# Patient Record
Sex: Female | Born: 1965 | Race: Black or African American | Hispanic: No | Marital: Married | Smoking: Never smoker
Health system: Southern US, Community
[De-identification: ages and names within clinical notes are randomized; demographics above are authoritative.]

## PROBLEM LIST (undated history)

## (undated) DIAGNOSIS — J45909 Unspecified asthma, uncomplicated: Secondary | ICD-10-CM

## (undated) DIAGNOSIS — N920 Excessive and frequent menstruation with regular cycle: Secondary | ICD-10-CM

## (undated) DIAGNOSIS — D242 Benign neoplasm of left breast: Secondary | ICD-10-CM

## (undated) DIAGNOSIS — I10 Essential (primary) hypertension: Secondary | ICD-10-CM

## (undated) HISTORY — DX: Unspecified asthma, uncomplicated: J45.909

## (undated) HISTORY — DX: Benign neoplasm of left breast: D24.2

## (undated) HISTORY — DX: Excessive and frequent menstruation with regular cycle: N92.0

---

## 2001-02-22 ENCOUNTER — Other Ambulatory Visit: Admission: RE | Admit: 2001-02-22 | Discharge: 2001-02-22 | Payer: Self-pay | Admitting: Obstetrics and Gynecology

## 2001-05-08 ENCOUNTER — Inpatient Hospital Stay (HOSPITAL_COMMUNITY): Admission: AD | Admit: 2001-05-08 | Discharge: 2001-05-08 | Payer: Self-pay | Admitting: Obstetrics and Gynecology

## 2001-05-09 ENCOUNTER — Encounter: Payer: Self-pay | Admitting: Obstetrics and Gynecology

## 2002-03-22 ENCOUNTER — Other Ambulatory Visit: Admission: RE | Admit: 2002-03-22 | Discharge: 2002-03-22 | Payer: Self-pay | Admitting: Obstetrics and Gynecology

## 2002-06-13 ENCOUNTER — Inpatient Hospital Stay (HOSPITAL_COMMUNITY): Admission: AD | Admit: 2002-06-13 | Discharge: 2002-06-17 | Payer: Self-pay | Admitting: Obstetrics and Gynecology

## 2002-06-15 ENCOUNTER — Encounter (INDEPENDENT_AMBULATORY_CARE_PROVIDER_SITE_OTHER): Payer: Self-pay | Admitting: Specialist

## 2004-07-10 ENCOUNTER — Other Ambulatory Visit: Admission: RE | Admit: 2004-07-10 | Discharge: 2004-07-10 | Payer: Self-pay | Admitting: Obstetrics and Gynecology

## 2006-02-04 ENCOUNTER — Other Ambulatory Visit: Admission: RE | Admit: 2006-02-04 | Discharge: 2006-02-04 | Payer: Self-pay | Admitting: Obstetrics and Gynecology

## 2008-05-04 ENCOUNTER — Ambulatory Visit (HOSPITAL_BASED_OUTPATIENT_CLINIC_OR_DEPARTMENT_OTHER): Admission: RE | Admit: 2008-05-04 | Discharge: 2008-05-04 | Payer: Self-pay | Admitting: Obstetrics and Gynecology

## 2008-05-04 ENCOUNTER — Ambulatory Visit: Payer: Self-pay | Admitting: Diagnostic Radiology

## 2010-05-16 ENCOUNTER — Ambulatory Visit (INDEPENDENT_AMBULATORY_CARE_PROVIDER_SITE_OTHER)
Admission: RE | Admit: 2010-05-16 | Discharge: 2010-05-16 | Payer: BC Managed Care – PPO | Source: Home / Self Care | Attending: Obstetrics and Gynecology | Admitting: Obstetrics and Gynecology

## 2010-05-16 DIAGNOSIS — Z1231 Encounter for screening mammogram for malignant neoplasm of breast: Secondary | ICD-10-CM

## 2010-05-16 DIAGNOSIS — R922 Inconclusive mammogram: Secondary | ICD-10-CM

## 2010-05-21 ENCOUNTER — Other Ambulatory Visit: Payer: Self-pay | Admitting: Obstetrics and Gynecology

## 2010-05-21 DIAGNOSIS — R928 Other abnormal and inconclusive findings on diagnostic imaging of breast: Secondary | ICD-10-CM

## 2010-05-30 ENCOUNTER — Ambulatory Visit
Admission: RE | Admit: 2010-05-30 | Discharge: 2010-05-30 | Disposition: A | Discharge status: Home / Self Care | Payer: BC Managed Care – PPO | Source: Ambulatory Visit | Attending: Obstetrics and Gynecology | Admitting: Obstetrics and Gynecology

## 2010-05-30 DIAGNOSIS — R928 Other abnormal and inconclusive findings on diagnostic imaging of breast: Secondary | ICD-10-CM

## 2010-07-02 ENCOUNTER — Encounter (HOSPITAL_BASED_OUTPATIENT_CLINIC_OR_DEPARTMENT_OTHER)
Admission: RE | Admit: 2010-07-02 | Discharge: 2010-07-02 | Disposition: A | Discharge status: Home / Self Care | Payer: BC Managed Care – PPO | Source: Ambulatory Visit | Attending: Specialist | Admitting: Specialist

## 2010-07-02 LAB — BASIC METABOLIC PANEL
BUN: 16 mg/dL (ref 6–23)
Calcium: 8.7 mg/dL (ref 8.4–10.5)
GFR calc non Af Amer: 50 mL/min — ABNORMAL LOW (ref >60)
Glucose, Bld: 118 mg/dL — ABNORMAL HIGH (ref 70–99)

## 2010-07-02 LAB — DIFFERENTIAL
Eosinophils Relative: 2 % (ref 0–5)
Lymphocytes Relative: 31 % (ref 12–46)
Lymphs Abs: 2.1 10*3/uL (ref 0.7–4.0)
Monocytes Absolute: 0.5 10*3/uL (ref 0.1–1.0)

## 2010-07-02 LAB — CBC
HCT: 35.3 % — ABNORMAL LOW (ref 36.0–46.0)
MCH: 28.2 pg (ref 26.0–34.0)
MCHC: 33.1 g/dL (ref 30.0–36.0)
MCV: 85.1 fL (ref 78.0–100.0)
RDW: 13.4 % (ref 11.5–15.5)

## 2010-07-08 ENCOUNTER — Ambulatory Visit (HOSPITAL_BASED_OUTPATIENT_CLINIC_OR_DEPARTMENT_OTHER)
Admission: RE | Admit: 2010-07-08 | Discharge: 2010-07-08 | Disposition: A | Discharge status: Home / Self Care | Payer: BC Managed Care – PPO | Source: Ambulatory Visit | Attending: Specialist | Admitting: Specialist

## 2010-07-08 ENCOUNTER — Other Ambulatory Visit: Payer: Self-pay | Admitting: Specialist

## 2010-07-08 DIAGNOSIS — D1739 Benign lipomatous neoplasm of skin and subcutaneous tissue of other sites: Secondary | ICD-10-CM | POA: Insufficient documentation

## 2010-07-08 DIAGNOSIS — Z01812 Encounter for preprocedural laboratory examination: Secondary | ICD-10-CM | POA: Insufficient documentation

## 2010-07-08 DIAGNOSIS — Z01818 Encounter for other preprocedural examination: Secondary | ICD-10-CM | POA: Insufficient documentation

## 2010-08-21 NOTE — Op Note (Signed)
  [name], [name]                  ACCOUNT NO.:  [id number]  MEDICAL RECORD NO.:  [id number]           PATIENT TYPE:  LOCATION:                                 FACILITY:  PHYSICIAN:  Earvin Hansen L. 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 The area has gotten bigger.  Increased pain and discomfort in the area and also is causing problems or range of motion with tenseness and stiffness.  PROCEDURES PLANNED:  Exploration of area, removal of the area liposuction assistance.  ANESTHESIA:  General.  The patient underwent general anesthesia and intubated orally.  Prep was done to the left shoulder and chest areas with a Hibiclens soap and solution walled off with sterile towels and drapes so as to make a sterile field.  Xylocaine 0.5% with epinephrine 1:200,000 concentration was injected locally within the mass, total of 30 mL.  This was allowed to set up and we then made a small incision laterally with a 15 blade and used a [place] catheter 20 - 3 enforced.  We were able to liposuction the area completely down with consistency of a lipoma subfascial.  So residual areas laterally was removed with direct excision using Metzenbaum scissors.  After proper hemostasis, the wound was then reclosed with 3-0 Monocryl x2 layers and a running subcuticular stitch of 3-0 Monocryl.  Steri-Strips and soft dressings were applied to all areas of pressure fashion with Hypafix tape and Xeroform.  She withstood the procedures very well, was taken to recovery in good condition.  ESTIMATED BLOOD LOSS:  Less than 50 mL.  COMPLICATIONS:  None.     Yaakov Guthrie. Shon Hough, M.D.     Cathie Hoops  D:  07/08/2010  T:  07/09/2010  Job:  [id number]  Electronically Signed by Louisa Second M.D. on 08/21/2010 04:09:30 PM

## 2010-09-06 NOTE — Discharge Summary (Signed)
[name], [name]                            ACCOUNT NO.:  [id number]   MEDICAL RECORD NO.:  [id number]                   PATIENT TYPE:  INP   LOCATION:  9120                                 [facility]ILITY:  [facility]   PHYSICIAN:  Osborn Coho, M.D.                DATE OF BIRTH:  [birth year]   DATE OF ADMISSION:  06/13/2002  DATE OF DISCHARGE:  06/17/2002                                 DISCHARGE SUMMARY   ADMISSION DIAGNOSES:  1. Intrauterine pregnancy at term.  2. Favorable cervix.  3. Multiple social issues.  4. Positive group B strep.  5. Desires induction of labor.  6. Multiparity.  7. Desires bilateral tubal ligation for sterilization.   DISCHARGE DIAGNOSES:  1. Intrauterine pregnancy at term.  2. Favorable cervix.  3. Multiple social issues.  4. Positive group B strep.  5. Desires induction of labor.  6. Multiparity.  7. Desires bilateral tubal ligation for sterilization.  8. Status post normal spontaneous vaginal delivery.  9. Status post bilateral tubal ligation for sterilization.  10.      Breastfeeding.   PROCEDURES THIS ADMISSION:  1. Normal spontaneous vaginal delivery of a viable female infant named Devin     who weighed 7 pounds 2 ounces and had Apgars of 9 and 9 on June 14, 2002 attended by Nigel Bridgeman, C.N.M.  2. Bilateral tubal ligation for sterilization on June 15, 2002 by Dr.     Jaymes Graff.   HOSPITAL COURSE:  The patient is a 45 year old married black female gravida  3 para 1-0-1-1 at 75 and six-sevenths weeks who was admitted for elective  induction of labor secondary to favorable cervix and multiple social issues.  She was artificially ruptured and progressed into labor spontaneously,  requiring Pitocin augmentation early in the morning around 4:30 a.m. on  June 14, 2002.  She also requested an epidural for labor at  approximately the same time.  She progressed steadily to delivery of a  viable female infant named Devin who weighed 7  pounds 2 ounces and had  Apgars of 9 and 9 on June 14, 2002 attended by Nigel Bridgeman, C.N.M.  She  desired bilateral tubal ligation for sterilization and underwent the same on  June 15, 2002 without complication by Dr. Jaymes Graff.  Postoperatively she has done well.  She is ambulating, voiding, and eating  without difficulty.  She did have a febrile incident of a temperature of  101.3 on approximately midnight of June 16, 2002.  She received standing  antibiotic treatment at that time and her temperature subsequently resolved,  and she has remained afebrile for greater than 24 hours since then.  She is  now deemed ready for discharge today.   DISCHARGE INSTRUCTIONS:  As per the [facility] OB[facility]N handout.    DISCHARGE MEDICATIONS:  1. Motrin 600 mg p.o. q.6h. p.r.n. pain.  2.  Tylox one to two p.o. q.4-6h. p.r.n. pain.  3. Prenatal vitamins daily.   DISCHARGE LABORATORY DATA:  Hemoglobin 11.0, wbc count 10.0, platelets 189.   DISCHARGE FOLLOW-UP:  In six weeks at [facility] or p.r.n.     Concha Pyo. Duplantis, C.N.M.              Osborn Coho, M.D.    SJD/MEDQ  D:  06/17/2002  T:  06/17/2002  Job:  [id number]

## 2010-09-06 NOTE — H&P (Signed)
[name], [name]                            ACCOUNT NO.:  [id number]   MEDICAL RECORD NO.:  [id number]                   PATIENT TYPE:  INP   LOCATION:  9171                                 [facility]ILITY[facility]   PHYSICIAN:  Vicki L. Emilee Barber, C.N.M.             DATE OF BIRTH:  1965/05/28   DATE OF ADMISSION:  06/13/2002  DATE OF DISCHARGE:                                HISTORY & PHYSICAL   HISTORY OF PRESENT ILLNESS:  [name] is a 45 year old Gravida III, Para I,  0/1/1 at 45 and 6/7th weeks, who presents for elective induction. The  patient has work issues that will require return to work by July 27, 2002.  The patient requested induction secondary to desiring full six week leave.  She understands the risks and benefits of induction including failure of  attempt, prolonged labor and a requirement of Cesarean section. She does  wish to proceed. The pregnancy has been remarkable for positive group B  strep, history of irregular menses, history of post partum depression.   PRENATAL LABORATORY DATA:  Blood type is O+, Rh antibody negative, VDRL non-  reactive, Rubella titer positive, hepatitis B surface antigen negative, HIV  non-reactive, sickle cell test negative, GC and Chlamydia cultures negative  in June. PAP was normal. AFP was normal. Hemoglobin upon entry to practice  was 12.7. It was 11.9 at 27 weeks. Estimated date of confinement of April 15, 2003 was established by 8 week ultrasound. Group B strep culture was  positive at 36 weeks.   HISTORY OF PRESENT ILLNESS:  The patient under care at approximately ten  weeks. She had an ultrasound at 18 weeks secondary to irregular menses.  Dating was consistent with previous dates. Normal anatomy was noted. At  approximately 36 weeks, she advised [place] that she had a requirement to return  to work by July 27, 2002. This would require a delivery by June 16, 2002  to allow for a full six week postpartum recovery. At that time,  induction  was discussed, although she was counseled that no medical reason for  induction existed and the risks and benefits were discussed. She had her  membranes stripped twice in the last four days in an attempt to stimulate  spontaneous labor. She was seen earlier today and again, risks and benefits  of induction were discussed with the patient by [name], C.N.M.  The patient did make the decision to proceed with induction.   OBSTETRICAL HISTORY:  In 1995, she had a vaginal birth of a female infant with  weight of 7 pounds and 6 ounces at 41 weeks. She was in labor 16-18 hours.  She had vacuum assisted vaginal birth. She had epidural anesthesia but no  complications. She did have some questionable post partum depression. 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 In January of 2003, she had a  spontaneous  miscarriage at 7-8 weeks without complication.   PAST MEDICAL HISTORY:  She was told years ago that she had a cyst on her  uterus but no current problems. She reports occasional yeast infections.  Reports usual childhood illnesses. She has history of hepatitis vaccination  immunization. She was on Ortho Evra until February of 2003. She had anemia  before and after her pregnancy in 1995. 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 She has questionable history  of migraines in the past but no official diagnosis.   ALLERGIES:  No known drug allergies.   FAMILY HISTORY:  Her father and maternal grandfather have had heart attacks.  Her mother, father, sister, and grandparents all have hypertension. Her  father and paternal grandmother all have diabetes mellitus. Her uncle had a  kidney transplant.   GENETIC HISTORY:  Family history of obesity.   SOCIAL HISTORY:  The patient is married to the father of the baby. His name  is [name]. The patient is African-American. She is of the [address] and  [employer]. She is graduate  educated. 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 Her husband is also graduate educated. He is also employed as a  Runner, broadcasting/film/video at [employer]. She has been followed by the Certified Nurse Midwife  Services at [facility] and Gynecologic Services. She denies  any alcohol, drug, or tobacco use during this pregnancy.   PHYSICAL EXAMINATION:  VITAL SIGNS: Stable. Afebrile.  HEENT: Within normal limits.  LUNGS: Clear breath sounds.  HEART: Regular rate and rhythm.  No murmur, rub, or gallop.  BREAST: Soft and nontender.  ABDOMEN: Fundal height is approximately 39 cm. Estimated fetal weight is 7-8  pounds. Uterine contractions every 3-5 minutes, 50 seconds, moderate  quality. Cervical examination 2-3 cm, 80% vertex and minus one station.  Fetal heart rate is reactive with no decelerations.  EXTREMITIES: Deep tendon reflexes are 2+ without clonus. There is a trace  edema noted.   IMPRESSION:  1. Intrauterine pregnancy at term.  2. Social issues with the patient desiring induction.  3. Favorable cervix.  4. Positive group B strep.   PLAN:  1. Admit to birthing suite for consult with Dr. Estanislado Pandy as attending     physician.  2. Plan epidural per patient request.  3. Artificial rupture of membranes as appropriate.  4. Plan group B strep prophylaxis with Penicillin per standard dosing.                                               [name], C.N.M.    VLL/MEDQ  D:  06/14/2002  T:  06/14/2002  Job:  [id number]   cc:   [name], M.D.  [address]  [place]  [place] [address]  Fax: [phone]

## 2010-09-06 NOTE — Op Note (Signed)
NAMEAZUCENA, DART                            ACCOUNT NO.:  [id number]   MEDICAL RECORD NO.:  [id number]                   PATIENT TYPE:  INP   LOCATION:  9120                                 [facility]ILITY[facility]   PHYSICIAN:  Naima A. Dillard, M.D.              DATE OF BIRTH:  1965-11-10   DATE OF PROCEDURE:  DATE OF DISCHARGE:                                 OPERATIVE REPORT   PREOPERATIVE DIAGNOSES:  Multiparity, desires sterilization.   POSTOPERATIVE DIAGNOSES:  Multiparity, desires sterilization.   PROCEDURE:  Postpartum tubal ligation.   ANESTHESIA:  General endotracheal tube.   SURGEON:  Naima A. Dillard, M.D.   IV FLUIDS:  400 mL crystalloid.   ESTIMATED BLOOD LOSS:  Minimal.   COMPLICATIONS:  None.   FINDINGS:  Normal appearing tubes bilaterally.  Normal appearing right  ovary.   PROCEDURE IN DETAIL:  Before the operation the patient was informed of all  forms of birth control.  Also told that the risks of the procedure are, but  not limited to, bleeding, infection, damage to internal organs such as  bowel, bladder, major blood vessels, and has a failure rate of about 1 in  250 with a tubal ligation.  The patient still consented to the procedure.   PROCEDURE IN DETAIL:  The patient was taken to the operating room where she  had an epidural but it was found to be inadequate.  She was given general  anesthesia.  She was prepped and draped in a normal sterile fashion.  5 mL  of 0.5% Marcaine with epinephrine was placed in an infraumbilical area.  About a 2 cm infraumbilical incision was then made with a scalpel and  carried down to the fascia.  Fascia was then incised and dissected  bilaterally using Mayo scissors.  The peritoneum was identified, tented up,  entered sharply with Metzenbaum scissors and extended bilaterally.  The  patient's right fallopian tube was identified, grasped with Babcock clamps,  followed out to the fimbriated end.  Right ovary was also seen  and noted to  be normal.  About 1.5 cm of the mid isthmic portion of the tube was ligated  with 2-0 plain and excised.  Hemostasis was assured.  The tube was returned  back to the abdomen.  The patient's left fallopian tube was identified,  followed out to the fimbriated end, grasped with Babcock clamps, and ligated  with 2-0 plain.  About a 1.5 cm segment of tube was removed from the mid  isthmic portion of the tube.  Hemostasis was assured.  The fascia was closed  with 0 Vicryl.  The skin was closed with 3-0 Vicryl in a subcuticular  stitch.  Sponge, lap, and needle counts were correct x2.  The patient went  to recovery room in stable condition.  Naima A. Normand Sloop, M.D.   NAD/MEDQ  D:  06/15/2002  T:  06/15/2002  Job:  [id number]

## 2010-11-20 ENCOUNTER — Other Ambulatory Visit: Payer: Self-pay | Admitting: Obstetrics and Gynecology

## 2010-11-20 DIAGNOSIS — N6009 Solitary cyst of unspecified breast: Secondary | ICD-10-CM

## 2010-12-03 ENCOUNTER — Other Ambulatory Visit: Payer: BC Managed Care – PPO

## 2011-01-07 ENCOUNTER — Other Ambulatory Visit: Payer: BC Managed Care – PPO

## 2011-02-19 ENCOUNTER — Ambulatory Visit
Admission: RE | Admit: 2011-02-19 | Discharge: 2011-02-19 | Disposition: A | Discharge status: Home / Self Care | Payer: BC Managed Care – PPO | Source: Ambulatory Visit | Attending: Obstetrics and Gynecology | Admitting: Obstetrics and Gynecology

## 2011-02-19 ENCOUNTER — Other Ambulatory Visit: Payer: BC Managed Care – PPO

## 2011-02-19 DIAGNOSIS — N6009 Solitary cyst of unspecified breast: Secondary | ICD-10-CM

## 2011-02-20 DIAGNOSIS — D242 Benign neoplasm of left breast: Secondary | ICD-10-CM

## 2011-02-20 HISTORY — DX: Benign neoplasm of left breast: D24.2

## 2011-06-17 ENCOUNTER — Encounter (INDEPENDENT_AMBULATORY_CARE_PROVIDER_SITE_OTHER): Payer: BC Managed Care – PPO | Admitting: Obstetrics and Gynecology

## 2011-07-30 ENCOUNTER — Telehealth: Payer: Self-pay | Admitting: Obstetrics and Gynecology

## 2011-07-31 NOTE — Telephone Encounter (Signed)
TC TO PT REGARDING MESSAGE  ABOUT BC. PT STATES THAT THE BC CONTROL THAT SHE WAS GIVEN MADE HER REALLY SICK(LYBREL) AND SHE WOULD LIKE TO TRY SOMETHING ELSE. PT C/O HEADACHES. INFORMED PT THAT I WOULD GIVE EP THE MESSAGE AND WILL GET BACK TO HER. PT VOICED UNDERSTANDING

## 2011-08-01 ENCOUNTER — Telehealth: Payer: Self-pay | Admitting: Obstetrics and Gynecology

## 2011-08-01 NOTE — Telephone Encounter (Signed)
Tc to pt concerning bc lybrel pt c/o elevated bp, headaches and blurred vision and seeing spots. Pt has d/c bc but she wants another bc. Pt to come in for eval. On 08-06-2011

## 2011-08-06 ENCOUNTER — Telehealth: Payer: Self-pay | Admitting: Obstetrics and Gynecology

## 2011-08-07 ENCOUNTER — Encounter: Payer: BC Managed Care – PPO | Admitting: Obstetrics and Gynecology

## 2011-08-07 ENCOUNTER — Telehealth: Payer: Self-pay

## 2011-08-07 NOTE — Telephone Encounter (Signed)
Spoke with pt rgd msg informed pt per EP need to keep appt to discuss changing bc. Pt declines appt pt states she cannot com to office to be seen . Pt states she's had several doctor appts in the last 3 weeks and do not want to come in to discuss bc. Pt states she wants EP to call her to discuss bc options advised pt per EP need ov pt declines ov . Informed pt will let EP know she wants her to call her pt voice understanding.

## 2011-08-07 NOTE — Telephone Encounter (Signed)
Spoke with pt rgd msg pt states was put on lybrel bc pt states had a stroke while on bc wants new rx for bc advised pt will consult with provider and call her back pt voice understanding.

## 2011-08-07 NOTE — Telephone Encounter (Signed)
Message copied by Rolla Plate on Thu Aug 07, 2011  4:34 PM ------      Message from: Henreitta Leber      Created: Thu Aug 07, 2011  4:31 PM      Regarding: Reschedule       Tiea Manninen,      Patient leaves message that she can't keep appointment for 08/07/28.  Please reschedule as she needs a consultation regarding her choice of options for control of menses.  Thanks,      EP

## 2011-08-08 ENCOUNTER — Telehealth: Payer: Self-pay

## 2011-08-08 NOTE — Telephone Encounter (Signed)
Due to the complexity and incomplete information related to patient's medical history (i.e. her relaying information about having recently experiencing a stroke),   the patient will need an office visit to gather more information about her current health status and to review options for contraception/menstrual cycle management.     Zenaya Ulatowski J. Lowell Guitar, PA-C

## 2011-08-08 NOTE — Telephone Encounter (Signed)
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

## 2011-08-12 ENCOUNTER — Telehealth: Payer: Self-pay | Admitting: Obstetrics and Gynecology

## 2011-08-12 NOTE — Telephone Encounter (Signed)
Pt called has many concerns regarding sx she had p starting bcp(her request)she had headaches she felt she was in a fog or had a stoke pt wants to be seen this week scheduled with AR since EP out of office Duanne Guess

## 2011-08-13 ENCOUNTER — Encounter: Payer: Self-pay | Admitting: Obstetrics and Gynecology

## 2011-08-13 ENCOUNTER — Ambulatory Visit (INDEPENDENT_AMBULATORY_CARE_PROVIDER_SITE_OTHER): Payer: BC Managed Care – PPO | Admitting: Obstetrics and Gynecology

## 2011-08-13 VITALS — BP 140/80 | HR 80 | Resp 18 | Ht 64.8 in | Wt 185.0 lb

## 2011-08-13 DIAGNOSIS — A499 Bacterial infection, unspecified: Secondary | ICD-10-CM

## 2011-08-13 DIAGNOSIS — Z139 Encounter for screening, unspecified: Secondary | ICD-10-CM

## 2011-08-13 DIAGNOSIS — N898 Other specified noninflammatory disorders of vagina: Secondary | ICD-10-CM

## 2011-08-13 DIAGNOSIS — B9689 Other specified bacterial agents as the cause of diseases classified elsewhere: Secondary | ICD-10-CM

## 2011-08-13 DIAGNOSIS — N920 Excessive and frequent menstruation with regular cycle: Secondary | ICD-10-CM

## 2011-08-13 DIAGNOSIS — N760 Acute vaginitis: Secondary | ICD-10-CM

## 2011-08-13 DIAGNOSIS — N949 Unspecified condition associated with female genital organs and menstrual cycle: Secondary | ICD-10-CM

## 2011-08-13 LAB — CBC
MCV: 84 fL (ref 78.0–100.0)
Platelets: 330 10*3/uL (ref 150–400)
RDW: 14.7 % (ref 11.5–15.5)
WBC: 6.2 10*3/uL (ref 4.0–10.5)

## 2011-08-13 LAB — POCT WET PREP (WET MOUNT): pH: 5.5

## 2011-08-13 LAB — PROLACTIN: Prolactin: 10.1 ng/mL

## 2011-08-13 MED ORDER — TINIDAZOLE 250 MG PO TABS: 2.0000 g | ORAL_TABLET | Freq: Every day | ORAL | Status: AC

## 2011-08-13 NOTE — Patient Instructions (Signed)
Patient Education Materials to be provided at check out (*indicates is located in accordion folder):  Endometrial Ablation 

## 2011-08-13 NOTE — Progress Notes (Signed)
C/o vaginal odor and multiple SEs on pill. Taking pill for menorrhagia but wants to discuss other options  Filed Vitals:   08/13/11 1014  BP: 140/80  Pulse: 80  Resp: 18   Pelvic exam: VULVA: normal appearing vulva with no masses, tenderness or lesions, VAGINA: normal appearing vagina with discharge and retained tampon, no lesions, CERVIX: normal appearing cervix without discharge or lesions, UTERUS: uterus is about 12wk size, shape, consistency and nontender, ADNEXA: normal adnexa in size, nontender and no masses.  Tampon removed without difficulty  A/P BV - Tindamax Menorrhagia - options and recs reviewed.  Pt wants in office Ablation (Will sched along with Hyst/D&C) Next available for u/s and em bx

## 2011-08-14 ENCOUNTER — Telehealth: Payer: Self-pay | Admitting: Obstetrics and Gynecology

## 2011-08-14 NOTE — Telephone Encounter (Signed)
Jackie taking care of/seen yest. By AR

## 2011-08-14 NOTE — Telephone Encounter (Signed)
TC TO PT PHARMACY REGARDING MESSAGE. PHARMACY NEEDED FOR CLARITY ON MED TINDAMAX. INFORMED PHARM(MARGARET) PT IS TO TAKE TINDAMAX 500 MG 2 TIMES A DAY FOR 2 DAYS WITH BREAKFAST DISP 4.PHARMACY VOICED UNDERSTANDING  AND I LET PT KNOW THAT RX HAS BEEN CLARIFIED

## 2011-08-16 LAB — VITAMIN D 1,25 DIHYDROXY: Vitamin D 1, 25 (OH)2 Total: 83 pg/mL — ABNORMAL HIGH (ref 18–72)

## 2011-09-02 ENCOUNTER — Other Ambulatory Visit: Payer: Self-pay | Admitting: Obstetrics and Gynecology

## 2011-09-02 MED ORDER — IBUPROFEN 800 MG PO TABS: 800.0000 mg | ORAL_TABLET | Freq: Three times a day (TID) | ORAL | Status: AC | PRN

## 2011-09-02 MED ORDER — DIAZEPAM 10 MG PO TABS: 10.0000 mg | ORAL_TABLET | Freq: Once | ORAL | Status: AC

## 2011-09-02 MED ORDER — PROMETHAZINE HCL 25 MG PO TABS: 25.0000 mg | ORAL_TABLET | Freq: Four times a day (QID) | ORAL | Status: DC | PRN

## 2011-09-02 MED ORDER — HYDROCODONE-ACETAMINOPHEN 5-500 MG PO TABS: 1.0000 | ORAL_TABLET | Freq: Four times a day (QID) | ORAL | Status: AC | PRN

## 2011-09-08 ENCOUNTER — Telehealth: Payer: Self-pay | Admitting: Obstetrics and Gynecology

## 2011-09-08 NOTE — Telephone Encounter (Signed)
Hysteroscopy D&C/Ablation scheduled for 09/25/11 @ 11:45 with AR.  Per Cassandra, it will be covered at 100% after a $70 copayment; Ref # [id number] Gardiner Ramus

## 2011-09-10 ENCOUNTER — Ambulatory Visit (INDEPENDENT_AMBULATORY_CARE_PROVIDER_SITE_OTHER): Payer: BC Managed Care – PPO

## 2011-09-10 ENCOUNTER — Other Ambulatory Visit: Payer: Self-pay | Admitting: Obstetrics and Gynecology

## 2011-09-10 ENCOUNTER — Ambulatory Visit (INDEPENDENT_AMBULATORY_CARE_PROVIDER_SITE_OTHER): Payer: BC Managed Care – PPO | Admitting: Obstetrics and Gynecology

## 2011-09-10 VITALS — BP 110/62 | Ht 64.0 in | Wt 188.0 lb

## 2011-09-10 DIAGNOSIS — D219 Benign neoplasm of connective and other soft tissue, unspecified: Secondary | ICD-10-CM

## 2011-09-10 DIAGNOSIS — D259 Leiomyoma of uterus, unspecified: Secondary | ICD-10-CM

## 2011-09-10 DIAGNOSIS — N920 Excessive and frequent menstruation with regular cycle: Secondary | ICD-10-CM

## 2011-09-10 NOTE — Progress Notes (Signed)
Ultrasound today showing the uterus to measure 10.6 x 6.6 x 6.6 cm and normal bilateral ovaries.  There are 2 fibroids visualized one posterior right fibroid measuring 1.9 cm and anterior mid uterine fibroid measuring 2 cm.  Filed Vitals:   09/10/11 1443  BP: 110/62   Labs reviewed and within normal limits (CBC TSH vitamin D)  Assessment and plan Hysteroscopy D&C and ablation in the office on June 6

## 2011-09-22 DIAGNOSIS — D242 Benign neoplasm of left breast: Secondary | ICD-10-CM | POA: Insufficient documentation

## 2011-09-25 ENCOUNTER — Encounter: Payer: Self-pay | Admitting: Obstetrics and Gynecology

## 2011-09-25 ENCOUNTER — Other Ambulatory Visit: Payer: Self-pay | Admitting: Obstetrics and Gynecology

## 2011-09-25 ENCOUNTER — Ambulatory Visit (INDEPENDENT_AMBULATORY_CARE_PROVIDER_SITE_OTHER): Payer: BC Managed Care – PPO | Admitting: Obstetrics and Gynecology

## 2011-09-25 VITALS — BP 130/84 | HR 72 | Temp 98.0°F | Resp 18 | Ht 64.0 in | Wt 185.0 lb

## 2011-09-25 DIAGNOSIS — N920 Excessive and frequent menstruation with regular cycle: Secondary | ICD-10-CM

## 2011-09-25 DIAGNOSIS — IMO0002 Reserved for concepts with insufficient information to code with codable children: Secondary | ICD-10-CM

## 2011-09-25 DIAGNOSIS — Z0000 Encounter for general adult medical examination without abnormal findings: Secondary | ICD-10-CM

## 2011-09-25 MED ORDER — KETOROLAC TROMETHAMINE 60 MG/2ML IM SOLN
60.0000 mg | Freq: Once | INTRAMUSCULAR | Status: AC
  Administered 2011-09-25: 60 mg via INTRAMUSCULAR

## 2011-09-25 NOTE — Progress Notes (Signed)
Pt does not have pre-op medication.AR to talk with pt DF

## 2011-09-25 NOTE — Progress Notes (Signed)
consent signed per protocol second set of vitals 30 mins after procedure  BP: 116/74 Pulse: 72 Resp: 16   .  HYSTEROSCOPY WITH NOVASURE ENDOMETRIAL ABLATION PROCEDURE    Patient's Name: [name]  Date of Birth: Apr 16, 1966  Date of Procedure: 09/25/2011     Preoperative Diagnosis: menorrhagia  Postoperative Diagnosis: menorrhagia  Name of Operation/Procedure: 1. Hysteroscopy 2. D&C 3. Novasure Ablation  Estimated Hysteroscopic Fluid Deficit: 250cc  Findings: Uterus sounded to 10cm, Cervical length measured 4.5cm, Cavity length measured 5.5 cm, Cavity width measured 4.5cm, Power 124 watts, 133 secs ablation  EBL: Minimal   BMW:[id number] prior to procedure   Procedure: The patient was placed in the dorsal lithotomy position.  She was sterilely prepped and draped.  A Grave's speculum was placed in the vaginal vault.  The anterior lip of the cervix was grasped with a single tooth tenaculum.  Using marcaine and lidocaine 15ml at a ratio of 1:1, a paracervical block was placed in the sacrouterine pillars as we near the right and left parametrial areas.  Additionally, 5cc was injected n the anterior lip of the cervix.  A uterine dilator to 8 [place] was then used to dilate the internal cervical os.  The Slimline Hysteroscope was introduced with NS as the distention media.  The findings were fluffy endometrium.   Sharp curettage was performed.  If sharp curettage was performed, the specimen was sent to pathology accordingly.  The uterus was sounded to 10 cm.  The uterine cavity length measured 5.5 cm and set on the Novasure handpiece.  The device was then inserted transcervically and the cavity width measured 4.5 cm as indicated by the cavity width dial on the Novasure handpiece.  The Cavity Integrity Assessment safety feature was initiated and passed.  The delivery of Radio Frequency energy was delivered for a total of 133 seconds of treatment time at a power of 124 watts.  The device was  retracted and safely removed from the cavity.  Hysteroscope was introduced once again and good ablation results were noted.  The instruments were removed.  Sponge and needle counts were correct at completion of the procedure.  The patient was recovered and dismissed later.  She tolerated the procedure well.  Post procedure vitals were done:  Filed Vitals:   09/25/11 1829  BP: 130/84  Pulse:   Temp:   Resp:    Pt was in good condition upon leaving the office.  She was still groggy and escorted out in a wheelchair by Alvino Chapel, CMA and patient's husband.  [name]

## 2011-09-30 LAB — PATHOLOGY

## 2011-10-01 ENCOUNTER — Encounter: Payer: BC Managed Care – PPO | Admitting: Obstetrics and Gynecology

## 2011-10-02 ENCOUNTER — Encounter: Payer: BC Managed Care – PPO | Admitting: Obstetrics and Gynecology

## 2011-10-20 ENCOUNTER — Encounter: Payer: BC Managed Care – PPO | Admitting: Obstetrics and Gynecology

## 2011-10-28 ENCOUNTER — Ambulatory Visit (INDEPENDENT_AMBULATORY_CARE_PROVIDER_SITE_OTHER): Payer: BC Managed Care – PPO | Admitting: Obstetrics and Gynecology

## 2011-10-28 ENCOUNTER — Encounter: Payer: Self-pay | Admitting: Obstetrics and Gynecology

## 2011-10-28 VITALS — BP 118/74 | Temp 98.4°F | Resp 14 | Wt 184.0 lb

## 2011-10-28 DIAGNOSIS — R1084 Generalized abdominal pain: Secondary | ICD-10-CM

## 2011-10-28 DIAGNOSIS — N3281 Overactive bladder: Secondary | ICD-10-CM | POA: Insufficient documentation

## 2011-10-28 DIAGNOSIS — N949 Unspecified condition associated with female genital organs and menstrual cycle: Secondary | ICD-10-CM

## 2011-10-28 DIAGNOSIS — R300 Dysuria: Secondary | ICD-10-CM

## 2011-10-28 DIAGNOSIS — N898 Other specified noninflammatory disorders of vagina: Secondary | ICD-10-CM

## 2011-10-28 LAB — POCT URINALYSIS DIPSTICK
Bilirubin, UA: NEGATIVE
Glucose, UA: NEGATIVE
Nitrite, UA: NEGATIVE

## 2011-10-28 LAB — POCT WET PREP (WET MOUNT): pH: 5.5

## 2011-10-28 MED ORDER — TINIDAZOLE 500 MG PO TABS: 2.0000 g | ORAL_TABLET | Freq: Every day | ORAL | Status: AC

## 2011-10-28 MED ORDER — OXYBUTYNIN CHLORIDE 10 % TD GEL: 1.0000 | Freq: Every day | TRANSDERMAL | Status: DC

## 2011-10-28 NOTE — Progress Notes (Signed)
DATE OF SURGERY: 09/25/2011 TYPE OF SURGERY:hyst/ d&c Novasure PAIN:No VAG BLEEDING: no VAG DISCHARGE: no NORMAL GI FUNCTN: yes NORMAL GU FUNCTN: no pt has weird bladder sensation. Will check ccua  A. Endometrium, curettage:  Fragments of proliferative endometrium. Negative for hyperplasia, atypia or malignancy.   No complaints except cramping and urinary sxs as above  Filed Vitals:   10/28/11 1405  BP: 118/74  Temp: 98.4 F (36.9 C)  Resp: 14   ROS: noncontributory  Pelvic exam:  VULVA: normal appearing vulva with no masses, tenderness or lesions,  VAGINA: normal appearing vagina with normal color and discharge, no lesions, CERVIX: normal appearing cervix without discharge or lesions,  UTERUS: uterus is normal size, shape, consistency and mildly tender,  ADNEXA: normal adnexa in size, nontender and no masses.  Results for orders placed in visit on 10/28/11  POCT URINALYSIS DIPSTICK      Component Value Range   Color, UA yellow     Clarity, UA clear     Glucose, UA neg     Bilirubin, UA neg     Ketones, UA neg     Spec Grav, UA 1.025     Blood, UA trace     pH, UA 5.0     Protein, UA trace     Urobilinogen, UA 1.0     Nitrite, UA neg     Leukocytes, UA Negative    POCT WET PREP (WET MOUNT)      Component Value Range   Source Wet Prep POC       WBC, Wet Prep HPF POC       Bacteria Wet Prep HPF POC       BACTERIA WET PREP MORPHOLOGY POC       Clue Cells Wet Prep HPF POC Few     CLUE CELLS WET PREP WHIFF POC Positive Whiff     Yeast Wet Prep HPF POC       KOH Wet Prep POC       Trichomonas Wet Prep HPF POC       pH 5.5       A/P Rec trial of motrin - pt doesn't want to take anything H/o OAB - requests gel form (ocybutynin transdermal 1 pack to skin qd) - urine to cx BV -tindimax AEX next available

## 2011-10-30 LAB — URINE CULTURE: Colony Count: >=100000

## 2011-11-03 ENCOUNTER — Telehealth: Payer: Self-pay

## 2011-11-03 MED ORDER — CIPROFLOXACIN HCL 250 MG PO TABS: 250.0000 mg | ORAL_TABLET | Freq: Two times a day (BID) | ORAL | Status: AC

## 2011-11-03 NOTE — Telephone Encounter (Signed)
Notified pt of + UTI.  Cipro 250mg  bid x 3day sent to [facility] per AR.  Pt agreeable.  ld

## 2011-11-03 NOTE — Telephone Encounter (Signed)
Message copied by Larwance Rote on Mon Nov 03, 2011 11:37 AM ------      Message from: Osborn Coho      Created: Mon Nov 03, 2011 12:09 AM       Please ERx cipro 250mg  po q12hrs x 3 days.

## 2011-12-25 ENCOUNTER — Other Ambulatory Visit: Payer: Self-pay | Admitting: Obstetrics and Gynecology

## 2011-12-25 DIAGNOSIS — D249 Benign neoplasm of unspecified breast: Secondary | ICD-10-CM

## 2012-01-14 ENCOUNTER — Ambulatory Visit
Admission: RE | Admit: 2012-01-14 | Discharge: 2012-01-14 | Disposition: A | Discharge status: Home / Self Care | Payer: BC Managed Care – PPO | Source: Ambulatory Visit | Attending: Obstetrics and Gynecology | Admitting: Obstetrics and Gynecology

## 2012-01-14 DIAGNOSIS — D249 Benign neoplasm of unspecified breast: Secondary | ICD-10-CM

## 2012-01-15 ENCOUNTER — Encounter (INDEPENDENT_AMBULATORY_CARE_PROVIDER_SITE_OTHER): Payer: Self-pay | Admitting: General Surgery

## 2012-01-15 ENCOUNTER — Telehealth (INDEPENDENT_AMBULATORY_CARE_PROVIDER_SITE_OTHER): Payer: Self-pay | Admitting: General Surgery

## 2012-01-15 NOTE — Telephone Encounter (Signed)
Called patient to obtain clarification on reason for visit (appointment just dated "evaluation of abd pain"). Patient stated that she has not seen Dr. Velna Hatchet her PCP for a while, the abdominal pain has been increasing over time, but does not know the cause. Patient stated her son was tests from Crohn's and she is not sure if she may have the same condition. No tests have been performed to confirm/determine cause of the pain she has had. Patient advised that she could either keep the appointment and be referred to her GI (since we do not know cause), or she can contact her PCP who knows her history and ask to be referred to the GI who can run the appropriate tests to determine if she needs surgical intervention or not. And if they determine surgical intervention is needed, she can be referred to [place]. Patient agreed.

## 2012-01-16 ENCOUNTER — Other Ambulatory Visit: Payer: Self-pay | Admitting: Internal Medicine

## 2012-01-16 DIAGNOSIS — R1084 Generalized abdominal pain: Secondary | ICD-10-CM

## 2012-01-20 ENCOUNTER — Ambulatory Visit
Admission: RE | Admit: 2012-01-20 | Discharge: 2012-01-20 | Disposition: A | Discharge status: Home / Self Care | Payer: BC Managed Care – PPO | Source: Ambulatory Visit | Attending: Internal Medicine | Admitting: Internal Medicine

## 2012-01-20 DIAGNOSIS — R1084 Generalized abdominal pain: Secondary | ICD-10-CM

## 2013-04-28 ENCOUNTER — Other Ambulatory Visit: Payer: Self-pay | Admitting: Obstetrics and Gynecology

## 2013-04-28 DIAGNOSIS — Z1231 Encounter for screening mammogram for malignant neoplasm of breast: Secondary | ICD-10-CM

## 2013-06-08 ENCOUNTER — Ambulatory Visit (HOSPITAL_BASED_OUTPATIENT_CLINIC_OR_DEPARTMENT_OTHER): Payer: BC Managed Care – PPO

## 2013-06-15 ENCOUNTER — Ambulatory Visit (HOSPITAL_BASED_OUTPATIENT_CLINIC_OR_DEPARTMENT_OTHER)
Admission: RE | Admit: 2013-06-15 | Discharge: 2013-06-15 | Disposition: A | Discharge status: Home / Self Care | Payer: BC Managed Care – PPO | Source: Ambulatory Visit | Attending: Obstetrics and Gynecology | Admitting: Obstetrics and Gynecology

## 2013-06-15 DIAGNOSIS — Z1231 Encounter for screening mammogram for malignant neoplasm of breast: Secondary | ICD-10-CM | POA: Insufficient documentation

## 2014-02-20 ENCOUNTER — Encounter: Payer: Self-pay | Admitting: Obstetrics and Gynecology

## 2014-05-16 ENCOUNTER — Other Ambulatory Visit (HOSPITAL_COMMUNITY): Payer: Self-pay | Admitting: Obstetrics and Gynecology

## 2014-05-16 ENCOUNTER — Ambulatory Visit (HOSPITAL_COMMUNITY)
Admission: RE | Admit: 2014-05-16 | Discharge: 2014-05-16 | Disposition: A | Discharge status: Home / Self Care | Payer: BC Managed Care – PPO | Source: Ambulatory Visit | Attending: Obstetrics and Gynecology | Admitting: Obstetrics and Gynecology

## 2014-05-16 DIAGNOSIS — R140 Abdominal distension (gaseous): Secondary | ICD-10-CM

## 2014-05-16 DIAGNOSIS — R1010 Upper abdominal pain, unspecified: Secondary | ICD-10-CM

## 2014-05-16 DIAGNOSIS — R102 Pelvic and perineal pain: Secondary | ICD-10-CM

## 2014-05-16 DIAGNOSIS — K824 Cholesterolosis of gallbladder: Secondary | ICD-10-CM | POA: Diagnosis not present

## 2014-05-16 DIAGNOSIS — D251 Intramural leiomyoma of uterus: Secondary | ICD-10-CM | POA: Diagnosis not present

## 2014-07-06 ENCOUNTER — Other Ambulatory Visit: Payer: Self-pay | Admitting: Gastroenterology

## 2014-07-06 DIAGNOSIS — R1011 Right upper quadrant pain: Secondary | ICD-10-CM

## 2014-07-26 ENCOUNTER — Ambulatory Visit (HOSPITAL_COMMUNITY)
Admission: RE | Admit: 2014-07-26 | Discharge: 2014-07-26 | Disposition: A | Discharge status: Home / Self Care | Payer: BC Managed Care – PPO | Source: Ambulatory Visit | Attending: Gastroenterology | Admitting: Gastroenterology

## 2014-07-26 DIAGNOSIS — R1011 Right upper quadrant pain: Secondary | ICD-10-CM | POA: Diagnosis not present

## 2014-07-26 MED ORDER — TECHNETIUM TC 99M MEBROFENIN IV KIT
5.0000 | PACK | Freq: Once | INTRAVENOUS | Status: AC | PRN
  Administered 2014-07-26: 5 via INTRAVENOUS

## 2014-07-26 MED ORDER — SINCALIDE 5 MCG IJ SOLR: 0.0200 ug/kg | Freq: Once | INTRAMUSCULAR | Status: DC

## 2015-06-12 ENCOUNTER — Other Ambulatory Visit: Payer: Self-pay | Admitting: Nurse Practitioner

## 2015-06-12 DIAGNOSIS — N6314 Unspecified lump in the right breast, lower inner quadrant: Secondary | ICD-10-CM

## 2015-06-18 ENCOUNTER — Ambulatory Visit
Admission: RE | Admit: 2015-06-18 | Discharge: 2015-06-18 | Disposition: A | Discharge status: Home / Self Care | Payer: BC Managed Care – PPO | Source: Ambulatory Visit | Attending: Internal Medicine | Admitting: Internal Medicine

## 2015-06-18 DIAGNOSIS — N6314 Unspecified lump in the right breast, lower inner quadrant: Secondary | ICD-10-CM

## 2015-08-22 ENCOUNTER — Encounter (HOSPITAL_COMMUNITY): Payer: Self-pay | Admitting: Family Medicine

## 2015-08-22 ENCOUNTER — Emergency Department (HOSPITAL_COMMUNITY)
Admission: EM | Admit: 2015-08-22 | Discharge: 2015-08-22 | Disposition: A | Discharge status: Home / Self Care | Payer: BC Managed Care – PPO | Attending: Emergency Medicine | Admitting: Emergency Medicine

## 2015-08-22 ENCOUNTER — Emergency Department (HOSPITAL_COMMUNITY): Payer: BC Managed Care – PPO

## 2015-08-22 DIAGNOSIS — Z7952 Long term (current) use of systemic steroids: Secondary | ICD-10-CM | POA: Insufficient documentation

## 2015-08-22 DIAGNOSIS — I10 Essential (primary) hypertension: Secondary | ICD-10-CM | POA: Insufficient documentation

## 2015-08-22 DIAGNOSIS — M25512 Pain in left shoulder: Secondary | ICD-10-CM | POA: Insufficient documentation

## 2015-08-22 DIAGNOSIS — Z79899 Other long term (current) drug therapy: Secondary | ICD-10-CM | POA: Diagnosis not present

## 2015-08-22 DIAGNOSIS — R079 Chest pain, unspecified: Secondary | ICD-10-CM | POA: Insufficient documentation

## 2015-08-22 DIAGNOSIS — Z8742 Personal history of other diseases of the female genital tract: Secondary | ICD-10-CM | POA: Insufficient documentation

## 2015-08-22 DIAGNOSIS — R42 Dizziness and giddiness: Secondary | ICD-10-CM

## 2015-08-22 DIAGNOSIS — Z86018 Personal history of other benign neoplasm: Secondary | ICD-10-CM | POA: Diagnosis not present

## 2015-08-22 DIAGNOSIS — M542 Cervicalgia: Secondary | ICD-10-CM | POA: Diagnosis not present

## 2015-08-22 DIAGNOSIS — R0789 Other chest pain: Secondary | ICD-10-CM

## 2015-08-22 HISTORY — DX: Essential (primary) hypertension: I10

## 2015-08-22 LAB — BASIC METABOLIC PANEL
Anion gap: 7 (ref 5–15)
BUN: 15 mg/dL (ref 6–20)
CALCIUM: 9.5 mg/dL (ref 8.9–10.3)
CO2: 27 mmol/L (ref 22–32)
Chloride: 103 mmol/L (ref 101–111)
Creatinine, Ser: 1.24 mg/dL — ABNORMAL HIGH (ref 0.44–1.00)
GFR, EST AFRICAN AMERICAN: 59 mL/min — AB (ref >60)
GFR, EST NON AFRICAN AMERICAN: 51 mL/min — AB (ref >60)
Glucose, Bld: 99 mg/dL (ref 65–99)
Potassium: 4 mmol/L (ref 3.5–5.1)
SODIUM: 137 mmol/L (ref 135–145)

## 2015-08-22 LAB — I-STAT TROPONIN, ED
Troponin i, poc: 0 ng/mL (ref 0.00–0.08)
Troponin i, poc: 0.01 ng/mL (ref 0.00–0.08)

## 2015-08-22 LAB — URINALYSIS, ROUTINE W REFLEX MICROSCOPIC
Bilirubin Urine: NEGATIVE
Glucose, UA: NEGATIVE mg/dL
Hgb urine dipstick: NEGATIVE
KETONES UR: NEGATIVE mg/dL
Nitrite: NEGATIVE
PROTEIN: NEGATIVE mg/dL
Specific Gravity, Urine: 1.004 — ABNORMAL LOW (ref 1.005–1.030)
pH: 6.5 (ref 5.0–8.0)

## 2015-08-22 LAB — URINE MICROSCOPIC-ADD ON

## 2015-08-22 LAB — CBC
HCT: 43.4 % (ref 36.0–46.0)
Hemoglobin: 14.6 g/dL (ref 12.0–15.0)
MCH: 29 pg (ref 26.0–34.0)
MCHC: 33.6 g/dL (ref 30.0–36.0)
MCV: 86.1 fL (ref 78.0–100.0)
PLATELETS: 273 10*3/uL (ref 150–400)
RBC: 5.04 MIL/uL (ref 3.87–5.11)
RDW: 12.8 % (ref 11.5–15.5)
WBC: 5 10*3/uL (ref 4.0–10.5)

## 2015-08-22 LAB — D-DIMER, QUANTITATIVE (NOT AT ARMC): D-Dimer, Quant: <0.27 ug/mL-FEU (ref 0.00–0.50)

## 2015-08-22 MED ORDER — SODIUM CHLORIDE 0.9 % IV BOLUS (SEPSIS)
1000.0000 mL | Freq: Once | INTRAVENOUS | Status: AC
  Administered 2015-08-22: 1000 mL via INTRAVENOUS

## 2015-08-22 MED ORDER — IBUPROFEN 800 MG PO TABS: 800.0000 mg | ORAL_TABLET | Freq: Three times a day (TID) | ORAL | Status: DC | PRN

## 2015-08-22 MED ORDER — LORAZEPAM 2 MG/ML IJ SOLN
1.0000 mg | Freq: Once | INTRAMUSCULAR | Status: AC
Start: 2015-08-22 — End: 2015-08-22
  Administered 2015-08-22: 1 mg via INTRAVENOUS
  Filled 2015-08-22: qty 1

## 2015-08-22 MED ORDER — MECLIZINE HCL 25 MG PO TABS: 25.0000 mg | ORAL_TABLET | Freq: Three times a day (TID) | ORAL | Status: DC | PRN

## 2015-08-22 NOTE — ED Notes (Signed)
Pt retruns from xray but immediately oob to BR with steady gait.

## 2015-08-22 NOTE — ED Notes (Signed)
Pt here for the feeling of "spaced out" and dizziness over the past few days. sts she woke up this am with left neck, shoulder and chest pain.

## 2015-08-22 NOTE — ED Provider Notes (Signed)
CSN: [id number]     Arrival date & time 08/22/15  [id number] History   First MD Initiated Contact with Patient 08/22/15 [phone]     Chief Complaint  Patient presents with  . Dizziness  . Neck Pain  . Shoulder Pain     (Consider location/radiation/quality/duration/timing/severity/associated sxs/prior Treatment) HPI Patient presents to the emergency department with vertigo, neck pain, shoulder pain with some chest discomfort.  Patient states the symptoms started earlier today, but she has had them previously as well.  She said the vertigo now for almost a month.  Patient was placed on medications for this by her primary care doctor, but still continues to have symptoms.  She states she has seen an ENT doctor who will reassess her in 1 month of her vertigo is not better.  Patient states that nothing seems to make the condition better.  She states that certain positions make the symptoms worse.  She states today she was at home when she had some neck and left shoulder pain.  She states she was concerned about this and that another reason she came to the emergency department. The patient denies , shortness of breath, headache,blurred vision, fever, cough, weakness, numbness, anorexia, edema, abdominal pain, nausea, vomiting, diarrhea, rash, back pain, dysuria, hematemesis, bloody stool, near syncope, or syncope. Past Medical History  Diagnosis Date  . 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 Menorrhagia   . Hypertension    History reviewed. No pertinent past surgical history. History reviewed. No pertinent family history. Social History  Substance Use Topics  . Smoking status: Never Smoker   . Smokeless tobacco: Never Used  . Alcohol Use: No   OB History    Gravida Para Term Preterm AB TAB SAB Ectopic Multiple Living   2 2             Review of Systems  All other systems negative except as documented in the HPI. All pertinent positives  and negatives as reviewed in the HPI.  Allergies  Amethyst  Home Medications   Prior to Admission medications   Medication Sig Start Date End Date Taking? Authorizing Provider  albuterol (PROVENTIL HFA;VENTOLIN HFA) 108 (90 Base) MCG/ACT inhaler Inhale 1-2 puffs into the lungs every 6 (six) hours as needed for wheezing or shortness of breath.   Yes Historical Provider, MD  amLODipine (NORVASC) 5 MG tablet Take 5 mg by mouth daily.   Yes Historical Provider, MD  hydrochlorothiazide (HYDRODIURIL) 25 MG tablet Take 25 mg by mouth daily.   Yes Historical Provider, MD  mometasone (NASONEX) 50 MCG/ACT nasal spray Place 2 sprays into the nose daily.   Yes Historical Provider, MD  valsartan (DIOVAN) 160 MG tablet Take 160 mg by mouth daily.   Yes Historical Provider, MD   BP 115/79 mmHg  Pulse 78  Temp(Src) 98.3 F (36.8 C)  Resp 15  SpO2 100% Physical Exam  Constitutional: She is oriented to person, place, and time. She appears well-developed and well-nourished. No distress.  HENT:  Head: Normocephalic and atraumatic.  Mouth/Throat: Oropharynx is clear and moist.  Eyes: Pupils are equal, round, and reactive to light.  Neck: Normal range of motion. Neck supple.  Cardiovascular: Normal rate, regular rhythm and normal heart sounds.  Exam reveals no gallop and no friction rub.   No murmur heard. Pulmonary/Chest: Effort normal and breath sounds normal. No respiratory distress. She has no wheezes.  Abdominal: Soft. Bowel sounds  are normal. She exhibits no distension. There is no tenderness.  Neurological: She is alert and oriented to person, place, and time. She exhibits normal muscle tone. Coordination normal.  Skin: Skin is warm and dry. No rash noted. No erythema.  Psychiatric: She has a normal mood and affect. Her behavior is normal.  Nursing note and vitals reviewed.   ED Course  Procedures (including critical care time) Labs Review Labs Reviewed  BASIC METABOLIC PANEL - Abnormal;  Notable for the following:    Creatinine, Ser 1.24 (*)    GFR calc non Af Amer 51 (*)    GFR calc Af Amer 59 (*)    All other components within normal limits  URINALYSIS, ROUTINE W REFLEX MICROSCOPIC (NOT AT [facility]) - Abnormal; Notable for the following:    Specific Gravity, Urine 1.004 (*)    Leukocytes, UA TRACE (*)    All other components within normal limits  URINE MICROSCOPIC-ADD ON - Abnormal; Notable for the following:    Squamous Epithelial / LPF 0-5 (*)    Bacteria, UA FEW (*)    All other components within normal limits  CBC  D-DIMER, QUANTITATIVE (NOT AT [facility])  I-STAT TROPOININ, ED  I-STAT TROPOININ, ED  CBG MONITORING, ED    Imaging Review Dg Chest 2 View  08/22/2015  CLINICAL DATA:  Shortness of breath and hypertension EXAM: CHEST  2 VIEW COMPARISON:  None. FINDINGS: Lungs are clear. Heart size and pulmonary vascularity are normal. No adenopathy. No bone lesions. IMPRESSION: No edema or consolidation. Electronically Signed   By: Lowella Grip III M.D.   On: 08/22/2015 09:51   Mr Brain Wo Contrast  08/22/2015  CLINICAL DATA:  Woke up with dizziness today. EXAM: MRI HEAD WITHOUT CONTRAST TECHNIQUE: Multiplanar, multiecho pulse sequences of the brain and surrounding structures were obtained without intravenous contrast. COMPARISON:  None. FINDINGS: There is a mildly to moderately expanded partially empty sella. There is no evidence of acute infarct, intracranial hemorrhage, mass, midline shift, or extra-axial fluid collection. Ventricles and sulci are normal. The brain is normal in signal. Orbits are unremarkable. Paranasal sinuses and mastoid air cells are clear. Major intracranial vascular flow voids are preserved. Limited assessment of the seventh and eighth cranial nerve complexes on this nondedicated study is unremarkable. No suspicious skull lesion. IMPRESSION: 1. No acute intracranial abnormality. 2. Partially empty sella, often an incidental finding though can be seen with  intracranial hypertension. Otherwise unremarkable appearance of the brain. Electronically Signed   By: Logan Bores M.D.   On: 08/22/2015 15:36   I have personally reviewed and evaluated these images and lab results as part of my medical decision-making.   EKG Interpretation   Date/Time:  Wednesday Aug 22 2015 08:33:32 EDT Ventricular Rate:  58 PR Interval:  194 QRS Duration: 92 QT Interval:  412 QTC Calculation: 404 R Axis:   78 Text Interpretation:  Sinus bradycardia Nonspecific ST and T wave  abnormality Abnormal ECG Confirmed by Alvino Chapel  MD, Ovid Curd [phone]) on  08/22/2015 10:52:19 AM      MDM   Final diagnoses:  None      The patient had an MRI obtained due to the fact that she had these vague symptoms along with the dizziness.  The patient is advised of the results and all questions were answered.  I advised that she will need follow with her primary care doctor.  I advised her that her testing for her heart today was normal, but this may need further  investigation.  Patient agrees the plan and all questions were answered.  I did prescribe her meclizine for the vertigo  Dalia Heading, PA-C 08/23/15 [place], MD 08/24/15 [id number]

## 2015-08-22 NOTE — ED Notes (Signed)
MD at bedside. 

## 2015-08-22 NOTE — Discharge Instructions (Signed)
Return here as needed.  Follow-up with your primary care doctor °

## 2015-08-22 NOTE — ED Notes (Signed)
Patient transported to X-ray 

## 2017-09-21 ENCOUNTER — Other Ambulatory Visit: Payer: Self-pay | Admitting: Nurse Practitioner

## 2017-09-21 DIAGNOSIS — R109 Unspecified abdominal pain: Secondary | ICD-10-CM

## 2017-09-28 ENCOUNTER — Ambulatory Visit
Admission: RE | Admit: 2017-09-28 | Discharge: 2017-09-28 | Disposition: A | Discharge status: Home / Self Care | Payer: BC Managed Care – PPO | Source: Ambulatory Visit | Attending: Nurse Practitioner | Admitting: Nurse Practitioner

## 2017-09-28 DIAGNOSIS — R109 Unspecified abdominal pain: Secondary | ICD-10-CM

## 2017-10-23 ENCOUNTER — Other Ambulatory Visit (HOSPITAL_COMMUNITY): Payer: Self-pay | Admitting: Internal Medicine

## 2017-10-23 DIAGNOSIS — R1011 Right upper quadrant pain: Secondary | ICD-10-CM

## 2017-11-02 ENCOUNTER — Encounter (HOSPITAL_COMMUNITY): Admission: RE | Admit: 2017-11-02 | Payer: BC Managed Care – PPO | Source: Ambulatory Visit

## 2017-11-18 ENCOUNTER — Ambulatory Visit (HOSPITAL_COMMUNITY): Payer: BC Managed Care – PPO

## 2018-05-06 ENCOUNTER — Encounter: Payer: Self-pay | Admitting: Internal Medicine

## 2018-05-24 ENCOUNTER — Ambulatory Visit: Payer: BC Managed Care – PPO | Admitting: Internal Medicine

## 2018-05-24 ENCOUNTER — Encounter: Payer: Self-pay | Admitting: Internal Medicine

## 2018-05-24 VITALS — BP 110/78 | HR 72 | Temp 98.0°F | Ht 64.6 in | Wt 190.8 lb

## 2018-05-24 DIAGNOSIS — M71452 Calcium deposit in bursa, left hip: Secondary | ICD-10-CM

## 2018-05-24 DIAGNOSIS — E6609 Other obesity due to excess calories: Secondary | ICD-10-CM

## 2018-05-24 DIAGNOSIS — I10 Essential (primary) hypertension: Secondary | ICD-10-CM | POA: Insufficient documentation

## 2018-05-24 DIAGNOSIS — Z6832 Body mass index (BMI) 32.0-32.9, adult: Secondary | ICD-10-CM

## 2018-05-24 MED ORDER — TELMISARTAN 80 MG PO TABS: 80.0000 mg | ORAL_TABLET | Freq: Every day | ORAL | 1 refills | Status: DC

## 2018-05-24 NOTE — Patient Instructions (Signed)
Hypercalcemia  Hypercalcemia is having too much calcium in the blood. The body needs calcium to make bones and keep them strong. Calcium also helps the muscles, nerves, brain, and heart work the way they should.  Most of the calcium in the body is in the bones. There is also some calcium in the blood. Hypercalcemia can happen when calcium comes out of the bones, or when the kidneys are not able to remove calcium from the blood. Hypercalcemia can be mild or severe.  What are the causes?  There are many possible causes of hypercalcemia. Common causes include:  · Hyperparathyroidism. This is a condition in which the body produces too much parathyroid hormone. There are four parathyroid glands in your neck. These glands produce a chemical messenger (hormone) that helps the body absorb calcium from foods and helps your bones release calcium.  · Certain kinds of cancer, such as lung cancer, breast cancer, or myeloma.  Less common causes of hypercalcemia include:  · Getting too much calcium or vitamin D from your diet.  · Kidney failure.  · Hyperthyroidism.  · Being on bed rest for a long time.  · Certain medicines.  · Infections.  · Sarcoidosis.  What increases the risk?  This condition is more likely to develop in:  · Women.  · People who are 60 years or older.  · People who have a family history of hypercalcemia.  What are the signs or symptoms?  Mild hypercalcemia that starts slowly may not cause symptoms. Severe, sudden hypercalcemia is more likely to cause symptoms, such as:  · Loss of appetite.  · Increased thirst and frequent urination.  · Fatigue.  · Nausea and vomiting.  · Headache.  · Abdominal pain.  · Muscle pain, twitching, or weakness.  · Constipation.  · Blood in the urine.  · Pain in the side of the back (flank pain).  · Anxiety, confusion, or depression.  · Irregular heartbeat (arrhythmia).  · Loss of consciousness.  How is this diagnosed?  This condition may be diagnosed based on:  · Your  symptoms.  · Blood tests.  · Urine tests.  · X-rays.  · Ultrasound.  · MRI.  · CT scan.  How is this treated?  Treatment for hypercalcemia depends on the cause. Treatment may include:  · Receiving fluids through an IV tube.  · Medicines that keep calcium levels steady after receiving fluids (loop diuretics).  · Medicines that keep calcium in your bones (bisphosphonates).  · Medicines that lower the calcium level in your blood.  · Surgery to remove overactive parathyroid glands.  Follow these instructions at home:  · Take over-the-counter and prescription medicines only as told by your health care provider.  · Follow instructions from your health care provider about eating or drinking restrictions.  · Drink enough fluid to keep your urine clear or pale yellow.  · Stay active. Weight-bearing exercise helps to keep calcium in your bones. Follow instructions from your health care provider about what type and level of exercise is safe for you.  · Keep all follow-up visits as told by your health care provider. This is important.  Contact a health care provider if:  · You have a fever.  · You have flank or abdominal pain that is getting worse.  Get help right away if:  · You have severe abdominal or flank pain.  · You have chest pain.  · You have trouble breathing.  · You become very confused and sleepy.  · 

## 2018-05-25 LAB — CMP14+EGFR
ALK PHOS: 38 IU/L — AB (ref 39–117)
ALT: 10 IU/L (ref 0–32)
AST: 16 IU/L (ref 0–40)
Albumin/Globulin Ratio: 1.9 (ref 1.2–2.2)
Albumin: 4.5 g/dL (ref 3.8–4.8)
BILIRUBIN TOTAL: 0.5 mg/dL (ref 0.0–1.2)
BUN/Creatinine Ratio: 14 (ref 9–23)
BUN: 18 mg/dL (ref 6–24)
CO2: 27 mmol/L (ref 20–29)
CREATININE: 1.27 mg/dL — AB (ref 0.57–1.00)
Calcium: 10.1 mg/dL (ref 8.7–10.2)
Chloride: 100 mmol/L (ref 96–106)
GFR calc Af Amer: 57 mL/min/{1.73_m2} — ABNORMAL LOW (ref >59)
GFR calc non Af Amer: 49 mL/min/{1.73_m2} — ABNORMAL LOW (ref >59)
GLUCOSE: 86 mg/dL (ref 65–99)
Globulin, Total: 2.4 g/dL (ref 1.5–4.5)
Potassium: 4.5 mmol/L (ref 3.5–5.2)
Sodium: 141 mmol/L (ref 134–144)
Total Protein: 6.9 g/dL (ref 6.0–8.5)

## 2018-05-25 LAB — PTH, INTACT AND CALCIUM: PTH: 15 pg/mL (ref 15–65)

## 2018-06-01 ENCOUNTER — Other Ambulatory Visit: Payer: Self-pay

## 2018-06-01 DIAGNOSIS — N289 Disorder of kidney and ureter, unspecified: Secondary | ICD-10-CM

## 2018-06-03 ENCOUNTER — Other Ambulatory Visit: Payer: Self-pay | Admitting: Internal Medicine

## 2018-06-20 NOTE — Progress Notes (Signed)
Subjective:     Patient ID: [name] , female    DOB: 11/21/1965 , 53 y.o.   MRN: [id number]   Chief Complaint  Patient presents with  . calcium build up in right hip    HPI  She is here today for evaluation of calcium issues. She reports having a calcium buildup in her hip. She is followed by Ortho. She was advised to get calcium levels checked.     Past Medical History:  Diagnosis Date  . 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 Hypertension   . Menorrhagia      Family History  Problem Relation Age of Onset  . Hypertension Mother   . Arthritis Mother   . Heart attack Father   . Hypertension Father   . Diabetes Father      Current Outpatient Medications:  .  albuterol (PROVENTIL HFA;VENTOLIN HFA) 108 (90 Base) MCG/ACT inhaler, Inhale 1-2 puffs into the lungs every 6 (six) hours as needed for wheezing or shortness of breath., Disp: , Rfl:  .  amLODipine (NORVASC) 5 MG tablet, Take 5 mg by mouth daily., Disp: , Rfl:  .  meclizine (ANTIVERT) 25 MG tablet, Take 1 tablet (25 mg total) by mouth 3 (three) times daily as needed for dizziness., Disp: 21 tablet, Rfl: 0 .  mometasone (NASONEX) 50 MCG/ACT nasal spray, Place 2 sprays into the nose daily., Disp: , Rfl:  .  TOVIAZ 4 MG TB24 tablet, , Disp: , Rfl:  .  traMADol (ULTRAM) 50 MG tablet, TAKE 1 TABLET BY MOUTH EVERY SIX HOURS AS NEEDED FOR PAIN, Disp: , Rfl:  .  telmisartan (MICARDIS) 80 MG tablet, Take 1 tablet (80 mg total) by mouth daily., Disp: 30 tablet, Rfl: 1 .  telmisartan-hydrochlorothiazide (MICARDIS HCT) 80-12.5 MG tablet, TAKE 1 TABLET BY MOUTH EVERY DAY, Disp: 90 tablet, Rfl: 1   Allergies  Allergen Reactions  . Amethyst [Levonorgestrel-Ethinyl Estrad] Palpitations    Other symptoms--slurred speech,headache,dizziness PG CMA     Review of Systems  Constitutional: Negative.   Respiratory: Negative.   Cardiovascular: Negative.   Gastrointestinal:  Negative.   Musculoskeletal: Positive for arthralgias (she has left hip pain. ).  Neurological: Negative.   Psychiatric/Behavioral: Negative.      Today's Vitals   05/24/18 1040  BP: 110/78  Pulse: 72  Temp: 98 F (36.7 C)  TempSrc: Oral  Weight: 190 lb 12.8 oz (86.5 kg)  Height: 5' 4.6" (1.641 m)   Body mass index is 32.15 kg/m.   Objective:  Physical Exam Vitals signs and nursing note reviewed.  Constitutional:      Appearance: Normal appearance.  HENT:     Head: Normocephalic and atraumatic.  Cardiovascular:     Rate and Rhythm: Normal rate and regular rhythm.     Heart sounds: Normal heart sounds.  Pulmonary:     Effort: Pulmonary effort is normal.     Breath sounds: Normal breath sounds.  Skin:    General: Skin is warm.  Neurological:     General: No focal deficit present.     Mental Status: She is alert.  Psychiatric:        Mood and Affect: Mood normal.        Behavior: Behavior normal.         Assessment And Plan:     1. 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 I  will check labs as listed below.  I will refer to specialist as needed. She is encouraged to avoid calcium supplementation.   - CMP14+EGFR - Parathyroid Hormone, Intact w/Ca  2. Essential hypertension, benign  Well controlled. I will stop the hctz portion of her medication. She is encouraged to avoid adding salt to her foods. She will rto in four weeks for re-evaluation.   3. Class 1 obesity due to excess calories with serious comorbidity and body mass index (BMI) of 32.0 to 32.9 in adult  She is encouraged to initially strive for BMI less than 30 to decrease cardiac risk. She is advised to exercise no less than 150 minutes per week.       Maximino Greenland, MD

## 2018-06-22 ENCOUNTER — Encounter: Payer: Self-pay | Admitting: Internal Medicine

## 2018-06-22 ENCOUNTER — Ambulatory Visit: Payer: BC Managed Care – PPO | Admitting: Internal Medicine

## 2018-06-22 VITALS — BP 116/72 | HR 71 | Ht 64.5 in | Wt 186.0 lb

## 2018-06-22 DIAGNOSIS — I10 Essential (primary) hypertension: Secondary | ICD-10-CM

## 2018-06-22 DIAGNOSIS — N289 Disorder of kidney and ureter, unspecified: Secondary | ICD-10-CM

## 2018-06-22 DIAGNOSIS — M7140 Calcium deposit in bursa, unspecified site: Secondary | ICD-10-CM | POA: Diagnosis not present

## 2018-06-22 DIAGNOSIS — M2550 Pain in unspecified joint: Secondary | ICD-10-CM | POA: Diagnosis not present

## 2018-06-22 NOTE — Progress Notes (Signed)
Subjective:     Patient ID: [name] , female    DOB: [birth year] , 53 y.o.   MRN: [id number]   Chief Complaint  Patient presents with  . Hypertension    follow up htn, dicuss referral to urology     HPI  She is here today for bp check. She was taken off of hctz at her last visit. She reports her blood pressures have remained stable. She also admits that she has not been taking the amlodipine. Admits she often forgets. Again, bp has remained stable.     Past Medical History:  Diagnosis Date  . 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 Hypertension   . Menorrhagia      Family History  Problem Relation Age of Onset  . Hypertension Mother   . Arthritis Mother   . Heart attack Father   . Hypertension Father   . Diabetes Father      Current Outpatient Medications:  .  albuterol (PROVENTIL HFA;VENTOLIN HFA) 108 (90 Base) MCG/ACT inhaler, Inhale 1-2 puffs into the lungs every 6 (six) hours as needed for wheezing or shortness of breath., Disp: , Rfl:  .  amLODipine (NORVASC) 5 MG tablet, Take 5 mg by mouth daily., Disp: , Rfl:  .  mometasone (NASONEX) 50 MCG/ACT nasal spray, Place 2 sprays into the nose daily., Disp: , Rfl:  .  telmisartan (MICARDIS) 80 MG tablet, Take 1 tablet (80 mg total) by mouth daily., Disp: 30 tablet, Rfl: 1 .  TOVIAZ 4 MG TB24 tablet, , Disp: , Rfl:    Allergies  Allergen Reactions  . Amethyst [Levonorgestrel-Ethinyl Estrad] Palpitations    Other symptoms--slurred speech,headache,dizziness PG CMA     Review of Systems  Constitutional: Negative.   Respiratory: Negative.   Cardiovascular: Negative.   Gastrointestinal: Negative.   Musculoskeletal: Positive for arthralgias (she c/o joint pains. advised by Ortho she had calcium deposits. ).  Neurological: Negative.   Psychiatric/Behavioral: Negative.      Today's Vitals   06/22/18 1059  BP: 116/72  Pulse: 71  SpO2: 97%  Weight: 186 lb  (84.4 kg)  Height: 5' 4.5" (1.638 m)   Body mass index is 31.43 kg/m.   Objective:  Physical Exam Vitals signs and nursing note reviewed.  Constitutional:      Appearance: Normal appearance.  HENT:     Head: Normocephalic and atraumatic.  Cardiovascular:     Rate and Rhythm: Normal rate and regular rhythm.     Heart sounds: Normal heart sounds.  Pulmonary:     Effort: Pulmonary effort is normal.     Breath sounds: Normal breath sounds.  Skin:    General: Skin is warm.  Neurological:     General: No focal deficit present.     Mental Status: She is alert.  Psychiatric:        Mood and Affect: Mood normal.        Behavior: Behavior normal.        Assessment And Plan:     1. Essential hypertension, benign  Well controlled. I am pleased with her progress. She is congratulated on her regular exercise regimen and encouraged to keep up the great work.   2. Renal insufficiency  I will check repeat BMET, I suspect this may have improved since she is no longer on diuretic.   - BMP8+EGFR  3. Calcium deposit in bursa  Ortho notes reviewed in full detail.  4. Arthralgia, unspecified joint  I will check an ANA. Patient has already left, I will add on if possible.   Maximino Greenland, MD

## 2018-06-23 LAB — BMP8+EGFR
BUN / CREAT RATIO: 13 (ref 9–23)
BUN: 15 mg/dL (ref 6–24)
CO2: 23 mmol/L (ref 20–29)
CREATININE: 1.19 mg/dL — AB (ref 0.57–1.00)
Calcium: 9 mg/dL (ref 8.7–10.2)
Chloride: 104 mmol/L (ref 96–106)
GFR calc Af Amer: 62 mL/min/{1.73_m2} (ref >59)
GFR calc non Af Amer: 53 mL/min/{1.73_m2} — ABNORMAL LOW (ref >59)
GLUCOSE: 90 mg/dL (ref 65–99)
POTASSIUM: 4.5 mmol/L (ref 3.5–5.2)
SODIUM: 139 mmol/L (ref 134–144)

## 2018-07-09 LAB — SPECIMEN STATUS REPORT

## 2018-07-09 LAB — ANTINUCLEAR ANTIBODIES, IFA: ANA TITER 1: NEGATIVE

## 2018-07-26 ENCOUNTER — Other Ambulatory Visit: Payer: Self-pay | Admitting: Internal Medicine

## 2018-09-28 ENCOUNTER — Encounter: Payer: BC Managed Care – PPO | Admitting: Internal Medicine

## 2018-09-29 ENCOUNTER — Encounter: Payer: Self-pay | Admitting: Internal Medicine

## 2018-10-05 ENCOUNTER — Telehealth: Payer: Self-pay

## 2018-10-05 NOTE — Telephone Encounter (Signed)
The pt said that she wanted to get her letter for accommodations to work from home because of her hypertension and asthma.  I told the pt that she needed a recent appt and hasn't been seen in a few months for a f/u.  DR. Baird Cancer tried to explain to the pt the importance of  Keeping her follow-up appointments.  The pt said that she will find a new doctor for her mental health because we are not accommodating to her. (Pt did not keep appt for physical because intake person did not change gloves after escorting a patient out prior to having her temperature taken). She left instead of being seen. Marland Kitchen

## 2018-10-11 ENCOUNTER — Encounter: Payer: Self-pay | Admitting: Internal Medicine

## 2018-10-12 ENCOUNTER — Ambulatory Visit: Payer: BC Managed Care – PPO | Admitting: Internal Medicine

## 2018-10-19 ENCOUNTER — Telehealth: Payer: Self-pay

## 2018-10-19 NOTE — Telephone Encounter (Signed)
Called pt to see how she is doing since her visit to the renal dr last week. Pt doesn't have any questions or concerns. Pt stated that everything is good.

## 2018-12-15 ENCOUNTER — Telehealth: Payer: Self-pay

## 2018-12-15 NOTE — Telephone Encounter (Signed)
Copied from [place] [id number]. Topic: Appointment Scheduling - Scheduling Inquiry for Clinic >> Dec 14, 2018 12:01 PM Scherrie Gerlach wrote: Reason for CRM:  pt would like to know if Dr Etter Sjogren will accept her as new pt. Pt states Dr Lynann Beaver is a colleague of hers, and recommended pt. and she has [employer].

## 2018-12-16 ENCOUNTER — Other Ambulatory Visit: Payer: Self-pay

## 2018-12-16 NOTE — Telephone Encounter (Signed)
ok 

## 2018-12-23 ENCOUNTER — Encounter: Payer: Self-pay | Admitting: Family Medicine

## 2018-12-23 ENCOUNTER — Other Ambulatory Visit: Payer: Self-pay

## 2018-12-23 ENCOUNTER — Ambulatory Visit: Payer: BC Managed Care – PPO | Admitting: Family Medicine

## 2018-12-23 VITALS — BP 130/90 | HR 89 | Temp 97.7°F | Resp 18 | Ht 64.5 in | Wt 185.8 lb

## 2018-12-23 DIAGNOSIS — I10 Essential (primary) hypertension: Secondary | ICD-10-CM

## 2018-12-23 LAB — COMPREHENSIVE METABOLIC PANEL
ALT: 7 U/L (ref 0–35)
AST: 15 U/L (ref 0–37)
Albumin: 4.3 g/dL (ref 3.5–5.2)
Alkaline Phosphatase: 34 U/L — ABNORMAL LOW (ref 39–117)
BUN: 17 mg/dL (ref 6–23)
CO2: 31 mEq/L (ref 19–32)
Calcium: 9.5 mg/dL (ref 8.4–10.5)
Chloride: 102 mEq/L (ref 96–112)
Creatinine, Ser: 1.17 mg/dL (ref 0.40–1.20)
GFR: 59.04 mL/min — ABNORMAL LOW (ref >60.00)
Glucose, Bld: 82 mg/dL (ref 70–99)
Potassium: 4.7 mEq/L (ref 3.5–5.1)
Sodium: 137 mEq/L (ref 135–145)
Total Bilirubin: 0.9 mg/dL (ref 0.2–1.2)
Total Protein: 6.8 g/dL (ref 6.0–8.3)

## 2018-12-23 LAB — CBC WITH DIFFERENTIAL/PLATELET
Basophils Absolute: 0 10*3/uL (ref 0.0–0.1)
Basophils Relative: 0.7 % (ref 0.0–3.0)
Eosinophils Absolute: 0.1 10*3/uL (ref 0.0–0.7)
Eosinophils Relative: 1.7 % (ref 0.0–5.0)
HCT: 40.3 % (ref 36.0–46.0)
Hemoglobin: 13.5 g/dL (ref 12.0–15.0)
Lymphocytes Relative: 39.6 % (ref 12.0–46.0)
Lymphs Abs: 2.1 10*3/uL (ref 0.7–4.0)
MCHC: 33.4 g/dL (ref 30.0–36.0)
MCV: 88.3 fl (ref 78.0–100.0)
Monocytes Absolute: 0.4 10*3/uL (ref 0.1–1.0)
Monocytes Relative: 7.9 % (ref 3.0–12.0)
Neutro Abs: 2.7 10*3/uL (ref 1.4–7.7)
Neutrophils Relative %: 50.1 % (ref 43.0–77.0)
Platelets: 269 10*3/uL (ref 150.0–400.0)
RBC: 4.56 Mil/uL (ref 3.87–5.11)
RDW: 13.8 % (ref 11.5–15.5)
WBC: 5.3 10*3/uL (ref 4.0–10.5)

## 2018-12-23 LAB — LIPID PANEL
Cholesterol: 165 mg/dL (ref 0–200)
HDL: 52.5 mg/dL (ref >39.00)
LDL Cholesterol: 97 mg/dL (ref 0–99)
NonHDL: 112.98
Total CHOL/HDL Ratio: 3
Triglycerides: 79 mg/dL (ref 0.0–149.0)
VLDL: 15.8 mg/dL (ref 0.0–40.0)

## 2018-12-23 LAB — TSH: TSH: 1.67 u[IU]/mL (ref 0.35–4.50)

## 2018-12-23 NOTE — Patient Instructions (Signed)

## 2018-12-23 NOTE — Progress Notes (Signed)
Patient ID: [name], female    DOB: 1965-08-01  Age: 53 y.o. MRN: [id number]    Subjective:  Subjective  HPI [name] presents to establish and f/u bp.  She has not taken her bp meds in over a week   She said her bp runs about 110/78 on meds      Review of Systems  Constitutional: Negative for appetite change, diaphoresis, fatigue and unexpected weight change.  Eyes: Negative for pain, redness and visual disturbance.  Respiratory: Negative for cough, chest tightness, shortness of breath and wheezing.   Cardiovascular: Negative for chest pain, palpitations and leg swelling.  Endocrine: Negative for cold intolerance, heat intolerance, polydipsia, polyphagia and polyuria.  Genitourinary: Negative for difficulty urinating, dysuria and frequency.  Neurological: Negative for dizziness, light-headedness, numbness and headaches.    History Past Medical History:  Diagnosis Date  . 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 Hypertension   . Menorrhagia     She has no past surgical history on file.   Her family history includes Arthritis in her mother; Diabetes in her father; Heart attack in her father; Heart attack (age of onset: 87) in her paternal grandfather; Hypertension in her father and mother.She reports that she has never smoked. She has never used smokeless tobacco. She reports that she does not drink alcohol or use drugs.  Current Outpatient Medications on File Prior to Visit  Medication Sig Dispense Refill  . albuterol (PROVENTIL HFA;VENTOLIN HFA) 108 (90 Base) MCG/ACT inhaler Inhale 1-2 puffs into the lungs every 6 (six) hours as needed for wheezing or shortness of breath.    Marland Kitchen amLODipine (NORVASC) 5 MG tablet Take 5 mg by mouth daily.    . ASPIRIN 81 PO Take by mouth. Pt reports taking PRN    . mometasone (NASONEX) 50 MCG/ACT nasal spray Place 2 sprays into the nose daily.    Marland Kitchen telmisartan (MICARDIS) 80 MG tablet  TAKE 1 TABLET BY MOUTH EVERY DAY 90 tablet 1  . TOVIAZ 4 MG TB24 tablet      No current facility-administered medications on file prior to visit.      Objective:  Objective  Physical Exam Vitals signs and nursing note reviewed.  Constitutional:      Appearance: She is well-developed.  HENT:     Head: Normocephalic and atraumatic.  Eyes:     Conjunctiva/sclera: Conjunctivae normal.  Neck:     Musculoskeletal: Normal range of motion and neck supple.     Thyroid: No thyromegaly.     Vascular: No carotid bruit or JVD.  Cardiovascular:     Rate and Rhythm: Normal rate and regular rhythm.     Heart sounds: Normal heart sounds. No murmur.  Pulmonary:     Effort: Pulmonary effort is normal. No respiratory distress.     Breath sounds: Normal breath sounds. No wheezing or rales.  Chest:     Chest wall: No tenderness.  Neurological:     Mental Status: She is alert and oriented to person, place, and time.    BP 130/90 (BP Location: Right Arm, Patient Position: Sitting, Cuff Size: Normal)   Pulse 89   Temp 97.7 F (36.5 C) (Temporal)   Resp 18   Ht 5' 4.5" (1.638 m)   Wt 185 lb 12.8 oz (84.3 kg)   SpO2 98%   BMI 31.40 kg/m  Wt Readings from Last 3 Encounters:  12/23/18 185 lb 12.8  oz (84.3 kg)  06/22/18 186 lb (84.4 kg)  05/24/18 190 lb 12.8 oz (86.5 kg)     Lab Results  Component Value Date   WBC 5.0 08/22/2015   HGB 14.6 08/22/2015   HCT 43.4 08/22/2015   PLT 273 08/22/2015   GLUCOSE 90 06/22/2018   ALT 10 05/24/2018   AST 16 05/24/2018   NA 139 06/22/2018   K 4.5 06/22/2018   CL 104 06/22/2018   CREATININE 1.19 (H) 06/22/2018   BUN 15 06/22/2018   CO2 23 06/22/2018   TSH 1.346 08/13/2011    US Abdomen Complete  Result Date: 09/28/2017 CLINICAL DATA:  Right-sided abdominal pain for several months EXAM: ABDOMEN ULTRASOUND COMPLETE COMPARISON:  05/16/2014. FINDINGS: Gallbladder: Gallbladder is well distended with evidence of small polyps. No gallstones are  seen. No pericholecystic fluid is noted. Common bile duct: Diameter: 3 mm. Liver: Mild increased echogenicity is noted likely related to fatty infiltration. No focal mass is noted. Portal vein is patent on color Doppler imaging with normal direction of blood flow towards the liver. IVC: No abnormality visualized. Pancreas: Visualized portion unremarkable. Spleen: Size and appearance within normal limits. Right Kidney: Length: 10.9 cm. Echogenicity within normal limits. No mass or hydronephrosis visualized. Left Kidney: Length: 10.4 cm. Echogenicity within normal limits. No mass or hydronephrosis visualized. Abdominal aorta: No aneurysm visualized. Other findings: None. IMPRESSION: Mild fatty infiltration of the liver. Gallbladder polyps stable in appearance from the prior exam. Electronically Signed   By: Inez Catalina M.D.   On: 09/28/2017 15:42     Assessment & Plan:  Plan  I am having [name] maintain her mometasone, amLODipine, albuterol, Toviaz, telmisartan, and ASPIRIN 81 PO.  No orders of the defined types were placed in this encounter.   Problem List Items Addressed This Visit      Unprioritized   Essential hypertension, benign    Restart meds F/u 2-3 weeks or sooner prn       Relevant Medications   ASPIRIN 81 PO    Other Visit Diagnoses    Essential hypertension    -  Primary   Relevant Medications   ASPIRIN 81 PO   Other Relevant Orders   TSH   Lipid panel   CBC with Differential/Platelet   Comprehensive metabolic panel      Follow-up: Return in about 2 weeks (around 01/06/2019), or if symptoms worsen or fail to improve, for hypertension.  Ann Held, DO

## 2018-12-23 NOTE — Assessment & Plan Note (Signed)
Restart meds F/u 2-3 weeks or sooner prn

## 2018-12-28 ENCOUNTER — Encounter: Payer: Self-pay | Admitting: Family Medicine

## 2018-12-28 ENCOUNTER — Other Ambulatory Visit: Payer: Self-pay | Admitting: Family Medicine

## 2018-12-28 NOTE — Telephone Encounter (Signed)
Requested medication (s) are due for refill today: no  Requested medication (s) are on the active medication list: yes  Last refill:  08/2015  Future visit scheduled: yes  Notes to clinic: review for refill   Requested Prescriptions  Pending Prescriptions Disp Refills   amLODipine (NORVASC) 5 MG tablet       Sig: Take 1 tablet (5 mg total) by mouth daily.     Cardiovascular:  Calcium Channel Blockers Failed - 12/28/2018 12:26 PM      Failed - Last BP in normal range    BP Readings from Last 1 Encounters:  12/23/18 130/90         Passed - Valid encounter within last 6 months    Recent Outpatient Visits          5 days ago Essential hypertension   [facility] [place], DO      Future Appointments            In 1 week [place], DO [employer] [facility] [employer], [facility]

## 2018-12-28 NOTE — Telephone Encounter (Signed)
Copied from [place] [phone]. Topic: Quick Communication - Rx Refill/Question >> Dec 28, 2018 12:21 PM Izola Price, [place] A wrote: Medication: amLODipine (NORVASC) 5 MG tablet   Has the patient contacted their pharmacy? Yes (Agent: If no, request that the patient contact the pharmacy for the refill.) (Agent: If yes, when and what did the pharmacy advise?)Contact PCP  Preferred Pharmacy (with phone number or street name): [facility] [place], [place] [phone] (Phone) [phone] (Fax)    Agent: Please be advised that RX refills may take up to 3 business days. We ask that you follow-up with your pharmacy.

## 2018-12-29 NOTE — Telephone Encounter (Signed)
This med is on the patient's list but you didn't prescribe. Did you want patient to restart this med?

## 2018-12-30 MED ORDER — AMLODIPINE BESYLATE 5 MG PO TABS: 5.0000 mg | ORAL_TABLET | Freq: Every day | ORAL | 1 refills | Status: DC

## 2019-01-06 ENCOUNTER — Ambulatory Visit: Payer: BC Managed Care – PPO | Admitting: Family Medicine

## 2019-01-06 ENCOUNTER — Encounter: Payer: Self-pay | Admitting: Family Medicine

## 2019-01-06 ENCOUNTER — Other Ambulatory Visit: Payer: Self-pay

## 2019-01-06 DIAGNOSIS — I10 Essential (primary) hypertension: Secondary | ICD-10-CM

## 2019-01-06 NOTE — Patient Instructions (Signed)

## 2019-01-06 NOTE — Assessment & Plan Note (Signed)
Cont meds  F/u 3 months Well controlled, no changes to meds. Encouraged heart healthy diet such as the DASH diet and exercise as tolerated.

## 2019-01-06 NOTE — Progress Notes (Signed)
Patient ID: [name], female    DOB: 1965-07-02  Age: 53 y.o. MRN: [id number]    Subjective:  Subjective  HPI BEAU COSTANTINO presents for bp check .  No complaints  Review of Systems  Constitutional: Negative for appetite change, diaphoresis, fatigue and unexpected weight change.  Eyes: Negative for pain, redness and visual disturbance.  Respiratory: Negative for cough, chest tightness, shortness of breath and wheezing.   Cardiovascular: Negative for chest pain, palpitations and leg swelling.  Endocrine: Negative for cold intolerance, heat intolerance, polydipsia, polyphagia and polyuria.  Genitourinary: Negative for difficulty urinating, dysuria and frequency.  Neurological: Negative for dizziness, light-headedness, numbness and headaches.    History Past Medical History:  Diagnosis Date  . 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 Hypertension   . Menorrhagia     She has no past surgical history on file.   Her family history includes Arthritis in her mother; Diabetes in her father; Heart attack in her father; Heart attack (age of onset: 21) in her paternal grandfather; Hypertension in her father and mother.She reports that she has never smoked. She has never used smokeless tobacco. She reports that she does not drink alcohol or use drugs.  Current Outpatient Medications on File Prior to Visit  Medication Sig Dispense Refill  . albuterol (PROVENTIL HFA;VENTOLIN HFA) 108 (90 Base) MCG/ACT inhaler Inhale 1-2 puffs into the lungs every 6 (six) hours as needed for wheezing or shortness of breath.    Marland Kitchen amLODipine (NORVASC) 5 MG tablet Take 1 tablet (5 mg total) by mouth daily. 90 tablet 1  . ASPIRIN 81 PO Take by mouth. Pt reports taking PRN    . mometasone (NASONEX) 50 MCG/ACT nasal spray Place 2 sprays into the nose daily.    Marland Kitchen telmisartan (MICARDIS) 80 MG tablet TAKE 1 TABLET BY MOUTH EVERY DAY 90 tablet 1  . TOVIAZ 4 MG  TB24 tablet      No current facility-administered medications on file prior to visit.      Objective:  Objective  Physical Exam Vitals signs and nursing note reviewed.  Constitutional:      Appearance: She is well-developed.  HENT:     Head: Normocephalic and atraumatic.  Eyes:     Conjunctiva/sclera: Conjunctivae normal.  Neck:     Musculoskeletal: Normal range of motion and neck supple.     Thyroid: No thyromegaly.     Vascular: No carotid bruit or JVD.  Cardiovascular:     Rate and Rhythm: Normal rate and regular rhythm.     Heart sounds: Normal heart sounds. No murmur.  Pulmonary:     Effort: Pulmonary effort is normal. No respiratory distress.     Breath sounds: Normal breath sounds. No wheezing or rales.  Chest:     Chest wall: No tenderness.  Neurological:     Mental Status: She is alert and oriented to person, place, and time.  Psychiatric:        Mood and Affect: Mood normal.        Behavior: Behavior normal.        Thought Content: Thought content normal.        Judgment: Judgment normal.    BP 118/70 (BP Location: Right Arm, Patient Position: Sitting, Cuff Size: Normal)   Pulse 64   Temp 97.9 F (36.6 C) (Temporal)   Resp 18   Ht 5' 4.5" (1.638 m)   Wt 186 lb  9.6 oz (84.6 kg)   SpO2 99%   BMI 31.54 kg/m  Wt Readings from Last 3 Encounters:  01/06/19 186 lb 9.6 oz (84.6 kg)  12/23/18 185 lb 12.8 oz (84.3 kg)  06/22/18 186 lb (84.4 kg)     Lab Results  Component Value Date   WBC 5.3 12/23/2018   HGB 13.5 12/23/2018   HCT 40.3 12/23/2018   PLT 269.0 12/23/2018   GLUCOSE 82 12/23/2018   CHOL 165 12/23/2018   TRIG 79.0 12/23/2018   HDL 52.50 12/23/2018   LDLCALC 97 12/23/2018   ALT 7 12/23/2018   AST 15 12/23/2018   NA 137 12/23/2018   K 4.7 12/23/2018   CL 102 12/23/2018   CREATININE 1.17 12/23/2018   BUN 17 12/23/2018   CO2 31 12/23/2018   TSH 1.67 12/23/2018    US Abdomen Complete  Result Date: 09/28/2017 CLINICAL DATA:   Right-sided abdominal pain for several months EXAM: ABDOMEN ULTRASOUND COMPLETE COMPARISON:  05/16/2014. FINDINGS: Gallbladder: Gallbladder is well distended with evidence of small polyps. No gallstones are seen. No pericholecystic fluid is noted. Common bile duct: Diameter: 3 mm. Liver: Mild increased echogenicity is noted likely related to fatty infiltration. No focal mass is noted. Portal vein is patent on color Doppler imaging with normal direction of blood flow towards the liver. IVC: No abnormality visualized. Pancreas: Visualized portion unremarkable. Spleen: Size and appearance within normal limits. Right Kidney: Length: 10.9 cm. Echogenicity within normal limits. No mass or hydronephrosis visualized. Left Kidney: Length: 10.4 cm. Echogenicity within normal limits. No mass or hydronephrosis visualized. Abdominal aorta: No aneurysm visualized. Other findings: None. IMPRESSION: Mild fatty infiltration of the liver. Gallbladder polyps stable in appearance from the prior exam. Electronically Signed   By: Inez Catalina M.D.   On: 09/28/2017 15:42     Assessment & Plan:  Plan  I am having Kemper T. Chernick maintain her mometasone, albuterol, Toviaz, telmisartan, ASPIRIN 81 PO, and amLODipine.  No orders of the defined types were placed in this encounter.   Problem List Items Addressed This Visit      Unprioritized   Essential hypertension, benign    Cont meds  F/u 3 months Well controlled, no changes to meds. Encouraged heart healthy diet such as the DASH diet and exercise as tolerated.          Follow-up: Return in about 3 months (around 04/07/2019), or if symptoms worsen or fail to improve, for hypertension.  Ann Held, DO

## 2019-04-06 ENCOUNTER — Other Ambulatory Visit: Payer: Self-pay

## 2019-04-07 ENCOUNTER — Encounter: Payer: Self-pay | Admitting: Family Medicine

## 2019-04-07 ENCOUNTER — Other Ambulatory Visit: Payer: Self-pay | Admitting: Internal Medicine

## 2019-04-07 ENCOUNTER — Ambulatory Visit: Payer: BC Managed Care – PPO | Admitting: Family Medicine

## 2019-04-07 VITALS — BP 112/80 | HR 65 | Temp 97.5°F | Resp 18 | Ht 64.5 in | Wt 186.4 lb

## 2019-04-07 DIAGNOSIS — I10 Essential (primary) hypertension: Secondary | ICD-10-CM | POA: Diagnosis not present

## 2019-04-07 DIAGNOSIS — J4520 Mild intermittent asthma, uncomplicated: Secondary | ICD-10-CM

## 2019-04-07 DIAGNOSIS — E785 Hyperlipidemia, unspecified: Secondary | ICD-10-CM | POA: Diagnosis not present

## 2019-04-07 DIAGNOSIS — F418 Other specified anxiety disorders: Secondary | ICD-10-CM | POA: Diagnosis not present

## 2019-04-07 LAB — LIPID PANEL
Cholesterol: 186 mg/dL (ref 0–200)
HDL: 55.2 mg/dL (ref >39.00)
LDL Cholesterol: 115 mg/dL — ABNORMAL HIGH (ref 0–99)
NonHDL: 130.71
Total CHOL/HDL Ratio: 3
Triglycerides: 80 mg/dL (ref 0.0–149.0)
VLDL: 16 mg/dL (ref 0.0–40.0)

## 2019-04-07 LAB — COMPREHENSIVE METABOLIC PANEL
ALT: 7 U/L (ref 0–35)
AST: 14 U/L (ref 0–37)
Albumin: 4.3 g/dL (ref 3.5–5.2)
Alkaline Phosphatase: 36 U/L — ABNORMAL LOW (ref 39–117)
BUN: 16 mg/dL (ref 6–23)
CO2: 31 mEq/L (ref 19–32)
Calcium: 9 mg/dL (ref 8.4–10.5)
Chloride: 103 mEq/L (ref 96–112)
Creatinine, Ser: 1.26 mg/dL — ABNORMAL HIGH (ref 0.40–1.20)
GFR: 54.14 mL/min — ABNORMAL LOW (ref >60.00)
Glucose, Bld: 75 mg/dL (ref 70–99)
Potassium: 4.1 mEq/L (ref 3.5–5.1)
Sodium: 139 mEq/L (ref 135–145)
Total Bilirubin: 0.9 mg/dL (ref 0.2–1.2)
Total Protein: 7.1 g/dL (ref 6.0–8.3)

## 2019-04-07 MED ORDER — ESCITALOPRAM OXALATE 10 MG PO TABS: 10.0000 mg | ORAL_TABLET | Freq: Every day | ORAL | 3 refills | Status: DC

## 2019-04-07 MED ORDER — AMLODIPINE BESYLATE 5 MG PO TABS: 5.0000 mg | ORAL_TABLET | Freq: Every day | ORAL | 1 refills | Status: DC

## 2019-04-07 MED ORDER — ALBUTEROL SULFATE HFA 108 (90 BASE) MCG/ACT IN AERS: 1.0000 | INHALATION_SPRAY | Freq: Four times a day (QID) | RESPIRATORY_TRACT | 3 refills | Status: DC | PRN

## 2019-04-07 MED ORDER — TELMISARTAN 80 MG PO TABS: 80.0000 mg | ORAL_TABLET | Freq: Every day | ORAL | 1 refills | Status: DC

## 2019-04-07 MED ORDER — TELMISARTAN 40 MG PO TABS: 40.0000 mg | ORAL_TABLET | Freq: Every day | ORAL | 1 refills | Status: DC

## 2019-04-07 NOTE — Progress Notes (Signed)
Patient ID: [name], female    DOB: [birth year]  Age: 53 y.o. MRN: [id number]    Subjective:  Subjective  HPI [name] presents for f/u bp.   She is also c/o inc anxiety and depression from having to work from home / covid.  She is having panic attacks and times and burst of anger other times.   Pt is no suicidal   Review of Systems  History Past Medical History:  Diagnosis Date  . 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 Hypertension   . Menorrhagia     She has no past surgical history on file.   Her family history includes Arthritis in her mother; Diabetes in her father; Heart attack in her father; Heart attack (age of onset: 57) in her paternal grandfather; Hypertension in her father and mother.She reports that she has never smoked. She has never used smokeless tobacco. She reports that she does not drink alcohol or use drugs.  Current Outpatient Medications on File Prior to Visit  Medication Sig Dispense Refill  . ASPIRIN 81 PO Take by mouth. Pt reports taking PRN    . mometasone (NASONEX) 50 MCG/ACT nasal spray Place 2 sprays into the nose daily.    . TOVIAZ 4 MG TB24 tablet      No current facility-administered medications on file prior to visit.     Objective:  Objective  Physical Exam Vitals and nursing note reviewed.  Constitutional:      Appearance: She is well-developed.  HENT:     Head: Normocephalic and atraumatic.  Eyes:     Conjunctiva/sclera: Conjunctivae normal.  Neck:     Thyroid: No thyromegaly.     Vascular: No carotid bruit or JVD.  Cardiovascular:     Rate and Rhythm: Normal rate and regular rhythm.     Heart sounds: Normal heart sounds. No murmur.  Pulmonary:     Effort: Pulmonary effort is normal. No respiratory distress.     Breath sounds: Normal breath sounds. No wheezing or rales.  Chest:     Chest wall: No tenderness.  Musculoskeletal:     Cervical back: Normal range of  motion and neck supple.  Neurological:     Mental Status: She is alert and oriented to person, place, and time.    BP 112/80 (BP Location: Right Arm, Patient Position: Sitting, Cuff Size: Normal)   Pulse 65   Temp (!) 97.5 F (36.4 C) (Temporal)   Resp 18   Ht 5' 4.5" (1.638 m)   Wt 186 lb 6.4 oz (84.6 kg)   SpO2 100%   BMI 31.50 kg/m  Wt Readings from Last 3 Encounters:  04/07/19 186 lb 6.4 oz (84.6 kg)  01/06/19 186 lb 9.6 oz (84.6 kg)  12/23/18 185 lb 12.8 oz (84.3 kg)     Lab Results  Component Value Date   WBC 5.3 12/23/2018   HGB 13.5 12/23/2018   HCT 40.3 12/23/2018   PLT 269.0 12/23/2018   GLUCOSE 82 12/23/2018   CHOL 165 12/23/2018   TRIG 79.0 12/23/2018   HDL 52.50 12/23/2018   LDLCALC 97 12/23/2018   ALT 7 12/23/2018   AST 15 12/23/2018   NA 137 12/23/2018   K 4.7 12/23/2018   CL 102 12/23/2018   CREATININE 1.17 12/23/2018   BUN 17 12/23/2018   CO2 31 12/23/2018   TSH 1.67 12/23/2018    US Abdomen Complete  Result  Date: 09/28/2017 CLINICAL DATA:  Right-sided abdominal pain for several months EXAM: ABDOMEN ULTRASOUND COMPLETE COMPARISON:  05/16/2014. FINDINGS: Gallbladder: Gallbladder is well distended with evidence of small polyps. No gallstones are seen. No pericholecystic fluid is noted. Common bile duct: Diameter: 3 mm. Liver: Mild increased echogenicity is noted likely related to fatty infiltration. No focal mass is noted. Portal vein is patent on color Doppler imaging with normal direction of blood flow towards the liver. IVC: No abnormality visualized. Pancreas: Visualized portion unremarkable. Spleen: Size and appearance within normal limits. Right Kidney: Length: 10.9 cm. Echogenicity within normal limits. No mass or hydronephrosis visualized. Left Kidney: Length: 10.4 cm. Echogenicity within normal limits. No mass or hydronephrosis visualized. Abdominal aorta: No aneurysm visualized. Other findings: None. IMPRESSION: Mild fatty infiltration of the  liver. Gallbladder polyps stable in appearance from the prior exam. Electronically Signed   By: Inez Catalina M.D.   On: 09/28/2017 15:42     Assessment & Plan:  Plan  I have discontinued Zimal T. Ketchum's telmisartan and telmisartan. I have also changed her albuterol. Additionally, I am having her start on escitalopram and telmisartan. Lastly, I am having her maintain her mometasone, Toviaz, ASPIRIN 81 PO, and amLODipine.  Meds ordered this encounter  Medications  . amLODipine (NORVASC) 5 MG tablet    Sig: Take 1 tablet (5 mg total) by mouth daily.    Dispense:  90 tablet    Refill:  1  . DISCONTD: telmisartan (MICARDIS) 80 MG tablet    Sig: Take 1 tablet (80 mg total) by mouth daily.    Dispense:  90 tablet    Refill:  1  . albuterol (VENTOLIN HFA) 108 (90 Base) MCG/ACT inhaler    Sig: Inhale 1-2 puffs into the lungs every 6 (six) hours as needed for wheezing or shortness of breath.    Dispense:  18 g    Refill:  3  . escitalopram (LEXAPRO) 10 MG tablet    Sig: Take 1 tablet (10 mg total) by mouth daily.    Dispense:  30 tablet    Refill:  3  . telmisartan (MICARDIS) 40 MG tablet    Sig: Take 1 tablet (40 mg total) by mouth daily.    Dispense:  30 tablet    Refill:  1    Problem List Items Addressed This Visit      Unprioritized   Depression with anxiety    Start lexapro F/u 1 month      Relevant Medications   escitalopram (LEXAPRO) 10 MG tablet   Essential hypertension, benign    Well controlled, no changes to meds. Encouraged heart healthy diet such as the DASH diet and exercise as tolerated.   Running low today----dec micardis to 40 mg daily      Relevant Medications   amLODipine (NORVASC) 5 MG tablet   telmisartan (MICARDIS) 40 MG tablet    Other Visit Diagnoses    Essential hypertension    -  Primary   Relevant Medications   amLODipine (NORVASC) 5 MG tablet   telmisartan (MICARDIS) 40 MG tablet   Other Relevant Orders   Lipid panel   Comprehensive  metabolic panel   Hyperlipidemia, unspecified hyperlipidemia type       Relevant Medications   amLODipine (NORVASC) 5 MG tablet   telmisartan (MICARDIS) 40 MG tablet   Other Relevant Orders   Lipid panel   Comprehensive metabolic panel   Mild intermittent asthma, unspecified whether complicated       Relevant  Medications   albuterol (VENTOLIN HFA) 108 (90 Base) MCG/ACT inhaler      Follow-up: Return in about 4 weeks (around 05/05/2019), or if symptoms worsen or fail to improve, for f/u anxiety / depression and f/u bp--- virtual is ok.  Ann Held, DO

## 2019-04-07 NOTE — Patient Instructions (Signed)

## 2019-04-07 NOTE — Assessment & Plan Note (Signed)
Start lexapro F/u 1 month

## 2019-04-07 NOTE — Assessment & Plan Note (Addendum)
Well controlled, no changes to meds. Encouraged heart healthy diet such as the DASH diet and exercise as tolerated.   Running low today----dec micardis to 40 mg daily

## 2019-04-19 ENCOUNTER — Other Ambulatory Visit: Payer: Self-pay

## 2019-04-19 DIAGNOSIS — N289 Disorder of kidney and ureter, unspecified: Secondary | ICD-10-CM

## 2019-04-19 NOTE — Progress Notes (Signed)
Ref nep

## 2019-04-27 ENCOUNTER — Ambulatory Visit: Payer: Self-pay

## 2019-04-27 ENCOUNTER — Telehealth: Payer: Self-pay

## 2019-04-27 ENCOUNTER — Encounter: Payer: Self-pay | Admitting: Family Medicine

## 2019-04-27 NOTE — Telephone Encounter (Signed)
Advised Triage nurse of recommendations

## 2019-04-27 NOTE — Telephone Encounter (Signed)
Received a phone call stating patient was complaining of leg pain, after a walk today also c/o ha behind left eye, sob,back pain and cough. Patient has a hx of Asthma and unsure if she was having side effects of the shingles vaccine she received yesterday or if it was covid related. P[facility] asked if we could do a virtual visit today to speak with patient regarding symptoms.

## 2019-04-27 NOTE — Telephone Encounter (Signed)
She can take benadryl  But if the sob does not improve she will need to go to ER--- that is not a common side effect

## 2019-04-27 NOTE — Telephone Encounter (Signed)
A lot of complaints and would not recommend virtual visit. Since near 4 pm recommend UC.

## 2019-04-27 NOTE — Telephone Encounter (Signed)
Pt. Called to report onset of low back pain, headache behind left eye, tightness and heaviness in buttocks and legs that started approx. 10:00 AM, when she was taking her usual daily walk.  Reported she had to have husband help her to walk up incline today, due to back pain and legs feeling very heavy.  Pt. reported mild shortness of breath, and intermittent pain in right chest, beneath rib cage, that "lasts about a second".  At present, denied any chest pain.  Stated she has an intermittent cough at baseline, due to hx of asthma.  Denied fever/ chills.   Reported no known exposure to COVID.  Reported she received a Shingles vaccine in left arm around 12:00 PM, yesterday, and questioned if her symptoms could be side effects of Shingles vaccine.   Advised pt. Should be evaluated today with multiple symptoms.  Phone call to [facility]; spoke with Tiffany.  She spoke with MD in office that recommended pt. To go [facility] or ER.  Pt advised of recommendation for the above, to wear a mask, and to have her husband drive her.  Verb. Understanding.  Agreed with plan.    Reason for Disposition . MILD difficulty breathing (e.g., minimal/no SOB at rest, SOB with walking, pulse <100)  Answer Assessment - Initial Assessment Questions 1. COVID-19 DIAGNOSIS: "Who made your Coronavirus (COVID-19) diagnosis?" "Was it confirmed by a positive lab test?" If not diagnosed by a HCP, ask "Are there lots of cases (community spread) where you live?" (See public health department website, if unsure)     Has not been tested for COVID  2. COVID-19 EXPOSURE: "Was there any known exposure to COVID before the symptoms began?" CDC Definition of close contact: within 6 feet (2 meters) for a total of 15 minutes or more over a 24-hour period.      Denied any exposure 3. ONSET: "When did the COVID-19 symptoms start?"      Approx. 10:00 AM  4. WORST SYMPTOM: "What is your worst symptom?" (e.g., cough, fever, shortness of breath, muscle aches)  Headache and lower back pain;  5. COUGH: "Do you have a cough?" If so, ask: "How bad is the cough?"       Stated has occas. Cough at baseline 6. FEVER: "Do you have a fever?" If so, ask: "What is your temperature, how was it measured, and when did it start?"     Denied fever/ chills  7. RESPIRATORY STATUS: "Describe your breathing?" (e.g., shortness of breath, wheezing, unable to speak)      Reported some shortness of breath at baseline with asthma;  8. BETTER-SAME-WORSE: "Are you getting better, staying the same or getting worse compared to yesterday?"  If getting worse, ask, "In what way?"     Onset this AM 9. HIGH RISK DISEASE: "Do you have any chronic medical problems?" (e.g., asthma, heart or lung disease, weak immune system, obesity, etc.)     Hx of asthma 10. PREGNANCY: "Is there any chance you are pregnant?" "When was your last menstrual period?"       Does not have periods anymore  11. OTHER SYMPTOMS: "Do you have any other symptoms?"  (e.g., chills, fatigue, headache, loss of smell or taste, muscle pain, sore throat; new loss of smell or taste especially support the diagnosis of COVID-19)      C/o intermittent Headache behind left eye; legs feel heavy and tight, lower back feels tight ; had shingles vaccine in upper arm 1/5; left arm is sore  at the site; cannot note any redness; has a little swelling.  Protocols used: CORONAVIRUS (COVID-19) DIAGNOSED OR SUSPECTED-A-AH

## 2019-04-28 NOTE — Telephone Encounter (Signed)
Noted and agree. 

## 2019-04-29 ENCOUNTER — Other Ambulatory Visit: Payer: Self-pay | Admitting: Family Medicine

## 2019-04-29 DIAGNOSIS — I10 Essential (primary) hypertension: Secondary | ICD-10-CM

## 2019-04-29 DIAGNOSIS — F418 Other specified anxiety disorders: Secondary | ICD-10-CM

## 2019-05-04 ENCOUNTER — Other Ambulatory Visit: Payer: Self-pay

## 2019-05-04 ENCOUNTER — Ambulatory Visit (INDEPENDENT_AMBULATORY_CARE_PROVIDER_SITE_OTHER): Payer: BC Managed Care – PPO | Admitting: Family Medicine

## 2019-05-04 ENCOUNTER — Encounter: Payer: Self-pay | Admitting: Family Medicine

## 2019-05-04 VITALS — BP 143/97 | HR 91 | Ht 64.5 in

## 2019-05-04 DIAGNOSIS — I10 Essential (primary) hypertension: Secondary | ICD-10-CM | POA: Diagnosis not present

## 2019-05-04 DIAGNOSIS — F418 Other specified anxiety disorders: Secondary | ICD-10-CM

## 2019-05-04 MED ORDER — TELMISARTAN 80 MG PO TABS: 80.0000 mg | ORAL_TABLET | Freq: Every day | ORAL | 1 refills | Status: DC

## 2019-05-04 NOTE — Assessment & Plan Note (Signed)
Stable con't lexapro   

## 2019-05-04 NOTE — Progress Notes (Signed)
Virtual Visit via Video Note  I connected with SEVANNA TRISDALE on 05/04/19 at 11:00 AM EST by a video enabled telemedicine application and verified that I am speaking with the correct person using two identifiers.  Location: Patient: home  Provider: home    I discussed the limitations of evaluation and management by telemedicine and the availability of in person appointments. The patient expressed understanding and agreed to proceed.  History of Present Illness: Pt is home f/u with anxiety and bp-- her bp has been running high but the anxiety is much better.  She is happy with the lexapro  No other complaints  Past Medical History:  Diagnosis Date  . 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 Hypertension   . Menorrhagia    Current Outpatient Medications on File Prior to Visit  Medication Sig Dispense Refill  . albuterol (VENTOLIN HFA) 108 (90 Base) MCG/ACT inhaler Inhale 1-2 puffs into the lungs every 6 (six) hours as needed for wheezing or shortness of breath. 18 g 3  . amLODipine (NORVASC) 5 MG tablet Take 1 tablet (5 mg total) by mouth daily. 90 tablet 1  . ASPIRIN 81 PO Take by mouth. Pt reports taking PRN    . escitalopram (LEXAPRO) 10 MG tablet TAKE 1 TABLET BY MOUTH EVERY DAY 90 tablet 2  . mometasone (NASONEX) 50 MCG/ACT nasal spray Place 2 sprays into the nose daily.    . TOVIAZ 4 MG TB24 tablet      No current facility-administered medications on file prior to visit.   Allergies  Allergen Reactions  . Amethyst [Levonorgestrel-Ethinyl Estrad] Palpitations    Other symptoms--slurred speech,headache,dizziness PG CMA    Observations/Objective: Vitals:   05/04/19 1053  BP: (!) 143/97  Pulse: 91  bp earlier this week 150/97,  145/ 60  Pt is in NAD Assessment and Plan: 1. Essential hypertension Poorly controlled will alter medications, encouraged DASH diet, minimize caffeine and obtain adequate sleep. Report  concerning symptoms and follow up as directed and as needed - telmisartan (MICARDIS) 80 MG tablet; Take 1 tablet (80 mg total) by mouth daily.  Dispense: 90 tablet; Refill: 1  2. Essential hypertension, benign See above   3. Depression with anxiety con't lexapro  Pt having not problems with med F/u 3 months or sooner prn    Follow Up Instructions:    I discussed the assessment and treatment plan with the patient. The patient was provided an opportunity to ask questions and all were answered. The patient agreed with the plan and demonstrated an understanding of the instructions.   The patient was advised to call back or seek an in-person evaluation if the symptoms worsen or if the condition fails to improve as anticipated.     Ann Held, DO

## 2019-05-04 NOTE — Assessment & Plan Note (Signed)
Poorly controlled will alter medications, encouraged DASH diet, minimize caffeine and obtain adequate sleep. Report concerning symptoms and follow up as directed and as needed 

## 2019-08-21 NOTE — Progress Notes (Signed)
[place] at [facility] [address], [place], [place] [address] [phone]  Date:  08/22/2019   Name:  [name]   DOB:  09-Jun-1965   MRN:  [id number]  PCP:  Ann Held, DO    Chief Complaint: Adenopathy (lymph nodes swelling, sore to touch after covid vaccine)   History of Present Illness:  [name] is a 54 y.o. very pleasant female patient who presents with the following:  Patient with history of hypertension, obesity who normally sees my partner Dr. Etter Sjogren.  I have not seen this patient myself previously Here today with concern of a bump on her collarbone area She noted a tender bump over her LEFT clavicle and in the left axilla  She got a covid vaccine in her LEFT deltoid- she got the shot a week ago.  Actually, patient reports that her provider started to give her the vaccine in the right deltoid, but something malfunction with the needle.  They then revaccinate her in the left deltoid.  The nodes popped up 2 days after her vaccine No fevers noted, but she does get hot flashes with menopause She felt a little tight in her left shoulder but otherwise not really painful  Her mammo is done at [facility]- it was done in the last 6 months, she has these done routinely No history of breast cancer  COVID-19 vaccine given a week ago today; 2nd dose  She did ok with her first dose except she felt sleepy Tetanus appears to be due-Tdap  Married with 2 children.  Her son is 75, her daughter is 12 and a high school junior  Patient Active Problem List   Diagnosis Date Noted  . Depression with anxiety 04/07/2019  . Essential hypertension, benign 05/24/2018  . Class 1 obesity due to excess calories with serious comorbidity and body mass index (BMI) of 32.0 to 32.9 in adult 05/24/2018  . Overactive bladder 10/28/2011    Class: History of  . Fibroadenoma of breast, left   . Menorrhagia 08/13/2011    Past  Medical History:  Diagnosis Date  . 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 Hypertension   . Menorrhagia     History reviewed. No pertinent surgical history.  Social History   Tobacco Use  . Smoking status: Never Smoker  . Smokeless tobacco: Never Used  Substance Use Topics  . Alcohol use: No  . Drug use: No    Family History  Problem Relation Age of Onset  . Hypertension Mother   . Arthritis Mother   . Heart attack Father   . Hypertension Father   . Diabetes Father   . Heart attack Paternal Grandfather 5    Allergies  Allergen Reactions  . Amethyst [Levonorgestrel-Ethinyl Estrad] Palpitations    Other symptoms--slurred speech,headache,dizziness PG CMA    Medication list has been reviewed and updated.  Current Outpatient Medications on File Prior to Visit  Medication Sig Dispense Refill  . albuterol (VENTOLIN HFA) 108 (90 Base) MCG/ACT inhaler Inhale 1-2 puffs into the lungs every 6 (six) hours as needed for wheezing or shortness of breath. 18 g 3  . amLODipine (NORVASC) 5 MG tablet Take 1 tablet (5 mg total) by mouth daily. 90 tablet 1  . ASPIRIN 81 PO Take by mouth. Pt reports taking PRN    . escitalopram (LEXAPRO) 10 MG tablet  TAKE 1 TABLET BY MOUTH EVERY DAY 90 tablet 2  . mometasone (NASONEX) 50 MCG/ACT nasal spray Place 2 sprays into the nose daily.    Marland Kitchen telmisartan (MICARDIS) 80 MG tablet Take 1 tablet (80 mg total) by mouth daily. 90 tablet 1  . TOVIAZ 4 MG TB24 tablet      No current facility-administered medications on file prior to visit.    Review of Systems:  As per HPI- otherwise negative.   Physical Examination: Vitals:   08/22/19 0926  BP: 124/85  Pulse: 67  Resp: 16  Temp: (!) 97.5 F (36.4 C)  SpO2: 95%   Vitals:   08/22/19 0926  Weight: 184 lb (83.5 kg)  Height: 5' 4.5" (1.638 m)   Body mass index is 31.1 kg/m. Ideal Body Weight: Weight in (lb) to have BMI = 25:  147.6  GEN: no acute distress.  Overweight, looks well  HEENT: Atraumatic, Normocephalic.  Ears and Nose: No external deformity. CV: RRR, No M/G/R. No JVD. No thrill. No extra heart sounds. PULM: CTA B, no wheezes, crackles, rhonchi. No retractions. No resp. distress. No accessory muscle use. ABD: S, NT, ND. No rebound. No HSM. EXTR: No c/c/e PSYCH: Normally interactive. Conversant.  Fullness is present in the left supraclavicular space, potentially consistent with lymphadenopathy. 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  This is almost certainly due to recent COVID-19 vaccine.  However, supraclavicular lymphadenopathy is a bit more unusual.  I counseled her that we will plan for an ultrasound in this area in about 2 weeks, hopefully by this time the node will have resolved.  If it persists we will plan accordingly  She will monitor her axillary lymphadenopathy, and will let me know if this is not resolved within 1 month.  Axillary lymphadenopathy is a known complication of the [id number] vaccine, and if it resolves I would encourage continued routine mammography  Moderate medical decision making today  This visit occurred during the SARS-CoV-2 public health emergency.  Safety protocols were in place, including screening questions prior to the visit, additional usage of staff PPE, and extensive cleaning of exam room while observing appropriate contact time as indicated for disinfecting solutions.    Signed Lamar Blinks, MD

## 2019-08-21 NOTE — Patient Instructions (Addendum)
It was very nice to meet you today!    I will arrange an ultrasound of your left supraclavicular lymph node in about 2 weeks.  Hopefully by then, the node will actually be gone.  We will plan further follow-up based on this result.  I suspect the nodes on your arm are likely due to your recent COVID-19 vaccine.  However, if these do not resolve in about 1 month please let me know and I will arrange an ultrasound.  Please be sure to continue routine mammograms as you normally do Let me know if you are getting worse or any other concerns in the meantime  I think you are due for a tetanus vaccine, I suggest having this done at your next routine visit

## 2019-08-22 ENCOUNTER — Encounter: Payer: Self-pay | Admitting: Family Medicine

## 2019-08-22 ENCOUNTER — Ambulatory Visit: Payer: BC Managed Care – PPO | Admitting: Family Medicine

## 2019-08-22 ENCOUNTER — Other Ambulatory Visit: Payer: Self-pay

## 2019-08-22 VITALS — BP 124/85 | HR 67 | Temp 97.5°F | Resp 16 | Ht 64.5 in | Wt 184.0 lb

## 2019-08-22 DIAGNOSIS — T50Z95A Adverse effect of other vaccines and biological substances, initial encounter: Secondary | ICD-10-CM | POA: Diagnosis not present

## 2019-08-22 DIAGNOSIS — R591 Generalized enlarged lymph nodes: Secondary | ICD-10-CM | POA: Diagnosis not present

## 2019-08-29 ENCOUNTER — Ambulatory Visit
Admission: RE | Admit: 2019-08-29 | Discharge: 2019-08-29 | Disposition: A | Discharge status: Home / Self Care | Payer: BC Managed Care – PPO | Source: Ambulatory Visit | Attending: Family Medicine | Admitting: Family Medicine

## 2019-08-29 ENCOUNTER — Encounter: Payer: Self-pay | Admitting: Family Medicine

## 2019-08-29 DIAGNOSIS — R591 Generalized enlarged lymph nodes: Secondary | ICD-10-CM

## 2019-09-14 ENCOUNTER — Other Ambulatory Visit: Payer: Self-pay

## 2019-09-14 ENCOUNTER — Telehealth (INDEPENDENT_AMBULATORY_CARE_PROVIDER_SITE_OTHER): Payer: BC Managed Care – PPO | Admitting: Family Medicine

## 2019-09-14 ENCOUNTER — Encounter: Payer: Self-pay | Admitting: Family Medicine

## 2019-09-14 VITALS — BP 126/91 | HR 74 | Temp 98.5°F | Wt 185.0 lb

## 2019-09-14 DIAGNOSIS — F419 Anxiety disorder, unspecified: Secondary | ICD-10-CM | POA: Diagnosis not present

## 2019-09-14 DIAGNOSIS — N951 Menopausal and female climacteric states: Secondary | ICD-10-CM

## 2019-09-14 MED ORDER — ESCITALOPRAM OXALATE 20 MG PO TABS: 20.0000 mg | ORAL_TABLET | Freq: Every day | ORAL | 1 refills | Status: DC

## 2019-09-14 NOTE — Progress Notes (Signed)
Virtual Visit via Video Note  I connected with [name] on 09/14/19 at  8:40 AM EDT by a video enabled telemedicine application and verified that I am speaking with the correct person using two identifiers.  Location: Patient: home alone Provider: home   I discussed the limitations of evaluation and management by telemedicine and the availability of in person appointments. The patient expressed understanding and agreed to proceed.  History of Present Illness: Pt is home c/o hot flashes that are worsening.  She started lexapro for anxiety and it has helped a lot  She has an appointment with gyn later this year    Observations/Objective:   Vitals with BMI 09/14/2019 08/22/2019 05/04/2019  Height - 5' 4.5" 5' 4.5"  Weight 185 lbs 184 lbs -  BMI [id number] -  Systolic [id number] [id number] A999333  Diastolic 91 85 97  Pulse 74 67 91  pt is experiencing hot flashes while on the video visit  Otherwise in Nad   Assessment and Plan: 1. Anxiety Improved with lexapro We are increasing lexapro to see if it helps the hot flashes - escitalopram (LEXAPRO) 20 MG tablet; Take 1 tablet (20 mg total) by mouth daily.  Dispense: 90 tablet; Refill: 1  2. Hot flashes due to menopause Increase lexapro 20 mg daily F/u 2-4 weeks or with gyn Follow Up Instructions:    I discussed the assessment and treatment plan with the patient. The patient was provided an opportunity to ask questions and all were answered. The patient agreed with the plan and demonstrated an understanding of the instructions.   The patient was advised to call back or seek an in-person evaluation if the symptoms worsen or if the condition fails to improve as anticipated.  I provided 25 minutes of non-face-to-face time during this encounter.   Ann Held, DO

## 2019-09-14 NOTE — Assessment & Plan Note (Signed)
Inc lexapro to 20 mg qhs F/u 2-4 weeks or with gyn

## 2019-09-14 NOTE — Assessment & Plan Note (Signed)
lexapro is helping a lot We are inc the dose to see if it helps with hot flashes

## 2019-10-25 ENCOUNTER — Encounter: Payer: Self-pay | Admitting: Family Medicine

## 2019-10-25 DIAGNOSIS — T753XXD Motion sickness, subsequent encounter: Secondary | ICD-10-CM

## 2019-10-26 MED ORDER — SCOPOLAMINE 1 MG/3DAYS TD PT72: 1.0000 | MEDICATED_PATCH | TRANSDERMAL | 0 refills | Status: DC

## 2019-11-02 ENCOUNTER — Other Ambulatory Visit: Payer: Self-pay | Admitting: Family Medicine

## 2019-11-02 DIAGNOSIS — I10 Essential (primary) hypertension: Secondary | ICD-10-CM

## 2020-01-26 ENCOUNTER — Other Ambulatory Visit: Payer: Self-pay | Admitting: Orthopedic Surgery

## 2020-01-26 DIAGNOSIS — M25512 Pain in left shoulder: Secondary | ICD-10-CM

## 2020-01-31 ENCOUNTER — Other Ambulatory Visit: Payer: Self-pay

## 2020-01-31 ENCOUNTER — Ambulatory Visit
Admission: RE | Admit: 2020-01-31 | Discharge: 2020-01-31 | Disposition: A | Discharge status: Home / Self Care | Payer: BC Managed Care – PPO | Source: Ambulatory Visit | Attending: Orthopedic Surgery | Admitting: Orthopedic Surgery

## 2020-01-31 DIAGNOSIS — M25512 Pain in left shoulder: Secondary | ICD-10-CM

## 2020-01-31 MED ORDER — GADOBENATE DIMEGLUMINE 529 MG/ML IV SOLN
15.0000 mL | Freq: Once | INTRAVENOUS | Status: AC | PRN
  Administered 2020-01-31: 15 mL via INTRAVENOUS

## 2020-02-10 ENCOUNTER — Other Ambulatory Visit: Payer: Self-pay | Admitting: Family Medicine

## 2020-02-10 DIAGNOSIS — E785 Hyperlipidemia, unspecified: Secondary | ICD-10-CM

## 2020-02-10 DIAGNOSIS — I10 Essential (primary) hypertension: Secondary | ICD-10-CM

## 2020-02-24 ENCOUNTER — Other Ambulatory Visit: Payer: Self-pay

## 2020-02-24 ENCOUNTER — Encounter: Payer: Self-pay | Admitting: Family Medicine

## 2020-02-24 ENCOUNTER — Telehealth (INDEPENDENT_AMBULATORY_CARE_PROVIDER_SITE_OTHER): Payer: BC Managed Care – PPO | Admitting: Family Medicine

## 2020-02-24 VITALS — Ht 64.5 in | Wt 188.0 lb

## 2020-02-24 DIAGNOSIS — J029 Acute pharyngitis, unspecified: Secondary | ICD-10-CM | POA: Diagnosis not present

## 2020-02-24 MED ORDER — AMOXICILLIN 875 MG PO TABS: 875.0000 mg | ORAL_TABLET | Freq: Two times a day (BID) | ORAL | 0 refills | Status: DC

## 2020-02-24 NOTE — Progress Notes (Signed)
Virtual Visit via Video Note  I connected with [name] on 02/24/20 at 10:20 AM EDT by a video enabled telemedicine application and verified that I am speaking with the correct person using two identifiers.  Location/ persons in visit : Patient: home alone  Provider: office /   I discussed the limitations of evaluation and management by telemedicine and the availability of in person appointments. The patient expressed understanding and agreed to proceed.  History of Present Illness:home Pt is home and c/o sore throat -- her niece was dx with strep yesterday and the pt started with sore throat Monday  No congestion   Observations/Objective: There were no vitals filed for this visit. No fever   Assessment and Plan: 1. Pharyngitis, unspecified etiology augmentin  F/u if no improvement  - amoxicillin (AMOXIL) 875 MG tablet; Take 1 tablet (875 mg total) by mouth 2 (two) times daily.  Dispense: 20 tablet; Refill: 0   Follow Up Instructions:    I discussed the assessment and treatment plan with the patient. The patient was provided an opportunity to ask questions and all were answered. The patient agreed with the plan and demonstrated an understanding of the instructions.   The patient was advised to call back or seek an in-person evaluation if the symptoms worsen or if the condition fails to improve as anticipated.  I provided 25 minutes of non-face-to-face time during this encounter.   Ann Held, DO

## 2020-02-27 ENCOUNTER — Ambulatory Visit: Payer: BC Managed Care – PPO | Attending: Family

## 2020-02-27 DIAGNOSIS — Z23 Encounter for immunization: Secondary | ICD-10-CM

## 2020-03-13 ENCOUNTER — Other Ambulatory Visit: Payer: Self-pay | Admitting: Family Medicine

## 2020-03-13 DIAGNOSIS — F419 Anxiety disorder, unspecified: Secondary | ICD-10-CM

## 2020-04-03 ENCOUNTER — Other Ambulatory Visit: Payer: Self-pay | Admitting: Orthopedic Surgery

## 2020-04-03 HISTORY — PX: LIPOMA EXCISION: SHX5283

## 2020-04-03 HISTORY — PX: ROTATOR CUFF REPAIR: SHX139

## 2020-05-22 NOTE — Progress Notes (Signed)
   Covid-19 Vaccination Clinic  Name:  CALLAHAN PEDDIE    MRN: [id number] DOB: [birth year]  05/22/2020  Ms. Flynt was observed post Covid-19 immunization for 15 minutes without incident. She was provided with Vaccine Information Sheet and instruction to access the V-Safe system.   Ms. Betzler was instructed to call 911 with any severe reactions post vaccine: Marland Kitchen Difficulty breathing  . Swelling of face and throat  . A fast heartbeat  . A bad rash all over body  . Dizziness and weakness   Immunizations Administered    Name Date Dose VIS Date Route   Pfizer COVID-19 Vaccine 02/27/2020  9:15 AM 0.3 mL 02/08/2020 Intramuscular   Manufacturer: [place]   Lot: [id number]   NDC: [address]-6

## 2020-07-04 ENCOUNTER — Other Ambulatory Visit: Payer: Self-pay | Admitting: Family Medicine

## 2020-07-04 DIAGNOSIS — F419 Anxiety disorder, unspecified: Secondary | ICD-10-CM

## 2020-07-23 ENCOUNTER — Other Ambulatory Visit: Payer: Self-pay

## 2020-07-24 ENCOUNTER — Other Ambulatory Visit: Payer: Self-pay

## 2020-07-24 ENCOUNTER — Ambulatory Visit (INDEPENDENT_AMBULATORY_CARE_PROVIDER_SITE_OTHER): Payer: BC Managed Care – PPO | Admitting: Family Medicine

## 2020-07-24 ENCOUNTER — Encounter: Payer: Self-pay | Admitting: Family Medicine

## 2020-07-24 VITALS — BP 110/74 | HR 70 | Temp 98.4°F | Resp 18 | Ht 64.5 in | Wt 195.8 lb

## 2020-07-24 DIAGNOSIS — Z23 Encounter for immunization: Secondary | ICD-10-CM | POA: Diagnosis not present

## 2020-07-24 DIAGNOSIS — Z1159 Encounter for screening for other viral diseases: Secondary | ICD-10-CM

## 2020-07-24 DIAGNOSIS — Z0000 Encounter for general adult medical examination without abnormal findings: Secondary | ICD-10-CM

## 2020-07-24 DIAGNOSIS — I10 Essential (primary) hypertension: Secondary | ICD-10-CM | POA: Diagnosis not present

## 2020-07-24 NOTE — Progress Notes (Signed)
Subjective:         [name] is a 55 y.o. female and is here for a comprehensive physical exam. The patient reports no problems.  Social History   Socioeconomic History  . Marital status: Married    Spouse name: Not on file  . Number of children: Not on file  . Years of education: Not on file  . Highest education level: Not on file  Occupational History  . Not on file  Tobacco Use  . Smoking status: Never Smoker  . Smokeless tobacco: Never Used  Vaping Use  . Vaping Use: Never used  Substance and Sexual Activity  . Alcohol use: No  . Drug use: No  . Sexual activity: Not on file  Other Topics Concern  . Not on file  Social History Narrative  . Not on file   Social Determinants of Health   Financial Resource Strain: Not on file  Food Insecurity: Not on file  Transportation Needs: Not on file  Physical Activity: Not on file  Stress: Not on file  Social Connections: Not on file  Intimate Partner Violence: Not on file   Health Maintenance  Topic Date Due  . Hepatitis C Screening  Never done  . HIV Screening  Never done  . TETANUS/TDAP  09/21/2018  . COVID-19 Vaccine (4 - Booster for Pfizer series) 08/26/2020  . INFLUENZA VACCINE  11/19/2020  . PAP SMEAR-Modifier  04/01/2021  . MAMMOGRAM  07/11/2022  . COLONOSCOPY (Pts 45-48yrs Insurance coverage will need to be confirmed)  06/18/2024  . HPV VACCINES  Aged Out    The following portions of the patient's history were reviewed and updated as appropriate:  She  has a past medical history of Fibroadenoma of breast, left (02/20/2011), Hypertension, and Menorrhagia. She does not have any pertinent problems on file. She  has no past surgical history on file. Her family history includes Arthritis in her mother; Diabetes in her father; Heart attack in her father; Heart attack (age of onset: 53) in her paternal grandfather; Hypertension in her father and mother. She  reports that she has never smoked. She has never used  smokeless tobacco. She reports that she does not drink alcohol and does not use drugs. She has a current medication list which includes the following prescription(s): albuterol, amlodipine, aspirin, escitalopram, mometasone, scopolamine, telmisartan, and toviaz. Current Outpatient Medications on File Prior to Visit  Medication Sig Dispense Refill  . albuterol (VENTOLIN HFA) 108 (90 Base) MCG/ACT inhaler Inhale 1-2 puffs into the lungs every 6 (six) hours as needed for wheezing or shortness of breath. 18 g 3  . amLODipine (NORVASC) 5 MG tablet TAKE 1 TABLET BY MOUTH EVERY DAY 90 tablet 1  . ASPIRIN 81 PO Take by mouth. Pt reports taking PRN    . escitalopram (LEXAPRO) 20 MG tablet Take 1 tablet (20 mg total) by mouth daily. 90 tablet 0  . mometasone (NASONEX) 50 MCG/ACT nasal spray Place 2 sprays into the nose daily.    Marland Kitchen scopolamine (TRANSDERM-SCOP, 1.5 MG,) 1 MG/3DAYS Place 1 patch (1.5 mg total) onto the skin every 3 (three) days. 5 patch 0  . telmisartan (MICARDIS) 80 MG tablet TAKE 1 TABLET BY MOUTH EVERY DAY 90 tablet 1  . TOVIAZ 4 MG TB24 tablet      No current facility-administered medications on file prior to visit.   She is allergic to tramadol and amethyst [levonorgestrel-ethinyl estrad]..  Review of Systems Review of Systems  Constitutional: Negative for  activity change, appetite change and fatigue.  HENT: Negative for hearing loss, congestion, tinnitus and ear discharge.  dentist q23m Eyes: Negative for visual disturbance (see optho q1y -- vision corrected to 20/20 with glasses).  Respiratory: Negative for cough, chest tightness and shortness of breath.   Cardiovascular: Negative for chest pain, palpitations and leg swelling.  Gastrointestinal: Negative for abdominal pain, diarrhea, constipation and abdominal distention.  Genitourinary: Negative for urgency, frequency, decreased urine volume and difficulty urinating.  Musculoskeletal: Negative for back pain, arthralgias and gait  problem.  Skin: Negative for color change, pallor and rash.  Neurological: Negative for dizziness, light-headedness, numbness and headaches.  Hematological: Negative for adenopathy. Does not bruise/bleed easily.  Psychiatric/Behavioral: Negative for suicidal ideas, confusion, sleep disturbance, self-injury, dysphoric mood, decreased concentration and agitation.       Objective:    BP 110/74 (BP Location: Right Arm, Patient Position: Sitting, Cuff Size: Normal)   Pulse 70   Temp 98.4 F (36.9 C) (Oral)   Resp 18   Ht 5' 4.5" (1.638 m)   Wt 195 lb 12.8 oz (88.8 kg)   SpO2 96%   BMI 33.09 kg/m  General appearance: alert, cooperative, appears stated age and no distress Head: Normocephalic, without obvious abnormality, atraumatic Eyes: negative findings: lids and lashes normal, conjunctivae and sclerae normal and pupils equal, round, reactive to light and accomodation Ears: normal TM's and external ear canals both ears Neck: no adenopathy, no carotid bruit, no JVD, supple, symmetrical, trachea midline and thyroid not enlarged, symmetric, no tenderness/mass/nodules Back: symmetric, no curvature. ROM normal. No CVA tenderness. Lungs: clear to auscultation bilaterally Breasts: gyn Heart: regular rate and rhythm, S1, S2 normal, no murmur, click, rub or gallop Abdomen: soft, non-tender; bowel sounds normal; no masses,  no organomegaly Pelvic: deferred gyn Extremities: extremities normal, atraumatic, no cyanosis or edema Pulses: 2+ and symmetric Skin: Skin color, texture, turgor normal. No rashes or lesions Lymph nodes: Cervical, supraclavicular, and axillary nodes normal. Neurologic: Alert and oriented X 3, normal strength and tone. Normal symmetric reflexes. Normal coordination and gait    Assessment:    Healthy female exam.      Plan:     ghm utd Check labs  See After Visit Summary for Counseling Recommendations    1. Preventative health care See above  - Lipid panel - CBC  with Differential/Platelet - TSH - Comprehensive metabolic panel  2. Primary hypertension Well controlled, no changes to meds. Encouraged heart healthy diet such as the DASH diet and exercise as tolerated.  - Lipid panel - CBC with Differential/Platelet - TSH - Comprehensive metabolic panel  3. Need for hepatitis C screening test  - Hepatitis C antibody  4. Need for tetanus booster  - Tdap vaccine greater than or equal to 7yo IM

## 2020-07-24 NOTE — Patient Instructions (Signed)
Preventive Care 84-53 Years Old, Female Preventive care refers to lifestyle choices and visits with your health care provider that can promote health and wellness. This includes:  A yearly physical exam. This is also called an annual wellness visit.  Regular dental and eye exams.  Immunizations.  Screening for certain conditions.  Healthy lifestyle choices, such as: ? Eating a healthy diet. ? Getting regular exercise. ? Not using drugs or products that contain nicotine and tobacco. ? Limiting alcohol use. What can I expect for my preventive care visit? Physical exam Your health care provider will check your:  Height and weight. These may be used to calculate your BMI (body mass index). BMI is a measurement that tells if you are at a healthy weight.  Heart rate and blood pressure.  Body temperature.  Skin for abnormal spots. Counseling Your health care provider may ask you questions about your:  Past medical problems.  Family's medical history.  Alcohol, tobacco, and drug use.  Emotional well-being.  Home life and relationship well-being.  Sexual activity.  Diet, exercise, and sleep habits.  Work and work Statistician.  Access to firearms.  Method of birth control.  Menstrual cycle.  Pregnancy history. What immunizations do I need? Vaccines are usually given at various ages, according to a schedule. Your health care provider will recommend vaccines for you based on your age, medical history, and lifestyle or other factors, such as travel or where you work.   What tests do I need? Blood tests  Lipid and cholesterol levels. These may be checked every 5 years, or more often if you are over 3 years old.  Hepatitis C test.  Hepatitis B test. Screening  Lung cancer screening. You may have this screening every year starting at age 73 if you have a 30-pack-year history of smoking and currently smoke or have quit within the past 15 years.  Colorectal cancer  screening. ? All adults should have this screening starting at age 52 and continuing until age 17. ? Your health care provider may recommend screening at age 49 if you are at increased risk. ? You will have tests every 1-10 years, depending on your results and the type of screening test.  Diabetes screening. ? This is done by checking your blood sugar (glucose) after you have not eaten for a while (fasting). ? You may have this done every 1-3 years.  Mammogram. ? This may be done every 1-2 years. ? Talk with your health care provider about when you should start having regular mammograms. This may depend on whether you have a family history of breast cancer.  BRCA-related cancer screening. This may be done if you have a family history of breast, ovarian, tubal, or peritoneal cancers.  Pelvic exam and Pap test. ? This may be done every 3 years starting at age 10. ? Starting at age 11, this may be done every 5 years if you have a Pap test in combination with an HPV test. Other tests  STD (sexually transmitted disease) testing, if you are at risk.  Bone density scan. This is done to screen for osteoporosis. You may have this scan if you are at high risk for osteoporosis. Talk with your health care provider about your test results, treatment options, and if necessary, the need for more tests. Follow these instructions at home: Eating and drinking  Eat a diet that includes fresh fruits and vegetables, whole grains, lean protein, and low-fat dairy products.  Take vitamin and mineral supplements  as recommended by your health care provider.  Do not drink alcohol if: ? Your health care provider tells you not to drink. ? You are pregnant, may be pregnant, or are planning to become pregnant.  If you drink alcohol: ? Limit how much you have to 0-1 drink a day. ? Be aware of how much alcohol is in your drink. In the [place] one drink equals one 12 oz bottle of beer (355 mL), one 5 oz glass of  wine (148 mL), or one 1 oz glass of hard liquor (44 mL).   Lifestyle  Take daily care of your teeth and gums. Brush your teeth every morning and night with fluoride toothpaste. Floss one time each day.  Stay active. Exercise for at least 30 minutes 5 or more days each week.  Do not use any products that contain nicotine or tobacco, such as cigarettes, e-cigarettes, and chewing tobacco. If you need help quitting, ask your health care provider.  Do not use drugs.  If you are sexually active, practice safe sex. Use a condom or other form of protection to prevent STIs (sexually transmitted infections).  If you do not wish to become pregnant, use a form of birth control. If you plan to become pregnant, see your health care provider for a prepregnancy visit.  If told by your health care provider, take low-dose aspirin daily starting at age 50.  Find healthy ways to cope with stress, such as: ? Meditation, yoga, or listening to music. ? Journaling. ? Talking to a trusted person. ? Spending time with friends and family. Safety  Always wear your seat belt while driving or riding in a vehicle.  Do not drive: ? If you have been drinking alcohol. Do not ride with someone who has been drinking. ? When you are tired or distracted. ? While texting.  Wear a helmet and other protective equipment during sports activities.  If you have firearms in your house, make sure you follow all gun safety procedures. What's next?  Visit your health care provider once a year for an annual wellness visit.  Ask your health care provider how often you should have your eyes and teeth checked.  Stay up to date on all vaccines. This information is not intended to replace advice given to you by your health care provider. Make sure you discuss any questions you have with your health care provider. Document Revised: 01/10/2020 Document Reviewed: 12/17/2017 Elsevier Patient Education  2021 Elsevier Inc.  

## 2020-07-25 LAB — COMPREHENSIVE METABOLIC PANEL WITH GFR
ALT: 7 U/L (ref 0–35)
AST: 15 U/L (ref 0–37)
Albumin: 4.2 g/dL (ref 3.5–5.2)
Alkaline Phosphatase: 40 U/L (ref 39–117)
BUN: 15 mg/dL (ref 6–23)
CO2: 30 meq/L (ref 19–32)
Calcium: 9.2 mg/dL (ref 8.4–10.5)
Chloride: 101 meq/L (ref 96–112)
Creatinine, Ser: 1.23 mg/dL — ABNORMAL HIGH (ref 0.40–1.20)
GFR: 50.55 mL/min — ABNORMAL LOW
Glucose, Bld: 81 mg/dL (ref 70–99)
Potassium: 4.5 meq/L (ref 3.5–5.1)
Sodium: 137 meq/L (ref 135–145)
Total Bilirubin: 0.7 mg/dL (ref 0.2–1.2)
Total Protein: 6.7 g/dL (ref 6.0–8.3)

## 2020-07-25 LAB — CBC WITH DIFFERENTIAL/PLATELET
Basophils Absolute: 0 10*3/uL (ref 0.0–0.1)
Basophils Relative: 0.8 % (ref 0.0–3.0)
Eosinophils Absolute: 0.1 10*3/uL (ref 0.0–0.7)
Eosinophils Relative: 2.4 % (ref 0.0–5.0)
HCT: 41.1 % (ref 36.0–46.0)
Hemoglobin: 13.7 g/dL (ref 12.0–15.0)
Lymphocytes Relative: 36 % (ref 12.0–46.0)
Lymphs Abs: 1.8 10*3/uL (ref 0.7–4.0)
MCHC: 33.4 g/dL (ref 30.0–36.0)
MCV: 87.5 fl (ref 78.0–100.0)
Monocytes Absolute: 0.4 10*3/uL (ref 0.1–1.0)
Monocytes Relative: 7.6 % (ref 3.0–12.0)
Neutro Abs: 2.7 10*3/uL (ref 1.4–7.7)
Neutrophils Relative %: 53.2 % (ref 43.0–77.0)
Platelets: 289 10*3/uL (ref 150.0–400.0)
RBC: 4.7 Mil/uL (ref 3.87–5.11)
RDW: 13.8 % (ref 11.5–15.5)
WBC: 5 10*3/uL (ref 4.0–10.5)

## 2020-07-25 LAB — LIPID PANEL
Cholesterol: 191 mg/dL (ref 0–200)
HDL: 55.5 mg/dL
LDL Cholesterol: 121 mg/dL — ABNORMAL HIGH (ref 0–99)
NonHDL: 135.51
Total CHOL/HDL Ratio: 3
Triglycerides: 73 mg/dL (ref 0.0–149.0)
VLDL: 14.6 mg/dL (ref 0.0–40.0)

## 2020-07-25 LAB — TSH: TSH: 1.82 u[IU]/mL (ref 0.35–4.50)

## 2020-07-25 LAB — HEPATITIS C ANTIBODY
Hepatitis C Ab: NONREACTIVE
SIGNAL TO CUT-OFF: 0

## 2020-08-02 ENCOUNTER — Encounter: Payer: Self-pay | Admitting: Family Medicine

## 2020-08-02 ENCOUNTER — Other Ambulatory Visit: Payer: Self-pay | Admitting: Family Medicine

## 2020-08-02 ENCOUNTER — Telehealth: Payer: Self-pay

## 2020-08-02 DIAGNOSIS — Z111 Encounter for screening for respiratory tuberculosis: Secondary | ICD-10-CM

## 2020-08-02 DIAGNOSIS — Z789 Other specified health status: Secondary | ICD-10-CM

## 2020-08-02 DIAGNOSIS — I10 Essential (primary) hypertension: Secondary | ICD-10-CM

## 2020-08-02 NOTE — Telephone Encounter (Addendum)
Patient needs  TB Gold or either TB skin test and a update to date Tdap because she is going back to school is it okay , to drop orders and schedule appointment

## 2020-08-02 NOTE — Telephone Encounter (Signed)
Orders dropped

## 2020-08-02 NOTE — Telephone Encounter (Signed)
Yes that is fine

## 2020-08-02 NOTE — Telephone Encounter (Signed)
Called [facility] to schedule lab appointment

## 2020-08-06 ENCOUNTER — Other Ambulatory Visit (INDEPENDENT_AMBULATORY_CARE_PROVIDER_SITE_OTHER): Payer: BC Managed Care – PPO

## 2020-08-06 ENCOUNTER — Other Ambulatory Visit: Payer: Self-pay

## 2020-08-06 DIAGNOSIS — Z789 Other specified health status: Secondary | ICD-10-CM

## 2020-08-06 DIAGNOSIS — Z111 Encounter for screening for respiratory tuberculosis: Secondary | ICD-10-CM

## 2020-08-06 NOTE — Telephone Encounter (Signed)
Lab appointment made.

## 2020-08-07 LAB — MEASLES/MUMPS/RUBELLA IMMUNITY
Mumps IgG: <9 AU/mL — ABNORMAL LOW
Rubella: 7.43 Index
Rubeola IgG: 82.2 AU/mL

## 2020-08-07 LAB — VARICELLA ZOSTER ANTIBODY, IGG: Varicella IgG: 3912 index

## 2020-08-09 ENCOUNTER — Ambulatory Visit (INDEPENDENT_AMBULATORY_CARE_PROVIDER_SITE_OTHER): Payer: BC Managed Care – PPO

## 2020-08-09 ENCOUNTER — Other Ambulatory Visit: Payer: Self-pay

## 2020-08-09 DIAGNOSIS — Z23 Encounter for immunization: Secondary | ICD-10-CM | POA: Diagnosis not present

## 2020-08-09 LAB — QUANTIFERON-TB GOLD PLUS
Mitogen-NIL: >10 IU/mL
NIL: 0.02 IU/mL
QuantiFERON-TB Gold Plus: NEGATIVE
TB1-NIL: 0 IU/mL
TB2-NIL: 0 IU/mL

## 2020-08-09 NOTE — Progress Notes (Signed)
[name] is a 55 y.o. female presents to the office today for MMR injections, per physician's orders. Original order: 08-07-2020 on lab results. MMR shot was administered IM today. Patient tolerated injection.   Jiles Prows

## 2020-08-23 ENCOUNTER — Other Ambulatory Visit: Payer: Self-pay | Admitting: Family Medicine

## 2020-08-23 DIAGNOSIS — E785 Hyperlipidemia, unspecified: Secondary | ICD-10-CM

## 2020-08-23 DIAGNOSIS — I10 Essential (primary) hypertension: Secondary | ICD-10-CM

## 2020-08-30 ENCOUNTER — Telehealth: Payer: Self-pay | Admitting: Family Medicine

## 2020-08-30 ENCOUNTER — Encounter: Payer: Self-pay | Admitting: Family Medicine

## 2020-08-30 ENCOUNTER — Other Ambulatory Visit: Payer: Self-pay | Admitting: Family Medicine

## 2020-08-30 DIAGNOSIS — Z021 Encounter for pre-employment examination: Secondary | ICD-10-CM

## 2020-08-30 NOTE — Telephone Encounter (Signed)
Uds ordered--- may need to be printed and faxed to the [facility] of her choice-- but we can send to [facility]

## 2020-08-30 NOTE — Telephone Encounter (Signed)
Patient need a 12 panel drug screening for school. [facility] is able to do it however, they need an order. Patient states she needs order asap  Please fax order to [phone]

## 2020-08-31 NOTE — Telephone Encounter (Signed)
Orders faxed

## 2020-10-04 ENCOUNTER — Other Ambulatory Visit: Payer: Self-pay | Admitting: Family Medicine

## 2020-10-04 DIAGNOSIS — F419 Anxiety disorder, unspecified: Secondary | ICD-10-CM

## 2020-11-09 ENCOUNTER — Ambulatory Visit: Payer: BC Managed Care – PPO | Admitting: Family

## 2020-11-22 ENCOUNTER — Other Ambulatory Visit: Payer: Self-pay | Admitting: Family Medicine

## 2020-11-22 DIAGNOSIS — I10 Essential (primary) hypertension: Secondary | ICD-10-CM

## 2020-12-12 ENCOUNTER — Other Ambulatory Visit: Payer: Self-pay | Admitting: Family Medicine

## 2020-12-12 DIAGNOSIS — J4520 Mild intermittent asthma, uncomplicated: Secondary | ICD-10-CM

## 2020-12-13 NOTE — Telephone Encounter (Signed)
Pt. Called in stating she is trying to request a refill for albuterol (VENTOLIN HFA) 108 (90 Base) but the pharmacy stated she needed to contact us to discuss refill. When pt was was previously prescribed the  prescription it came with 4 inhalers which have lasted her to now so she hasn't had to request a refill since then. Due to her allergies she is needing to use it but has ran out. Pt is wanting to know does she need to be seen for refill or can one be prescribed.

## 2020-12-30 IMAGING — MR MR SHOULDER*L* WO/W CM
5 of 8 series · 24 of 40 positions shown · IV contrast (multihance)
Comparison: None.

CLINICAL DATA: Left shoulder mass. History of lipoma removal.

EXAM:
MRI OF THE LEFT SHOULDER WITHOUT AND WITH CONTRAST
TECHNIQUE: Multiplanar, multisequence MR imaging of the LEFT shoulder was
performed before and after the administration of intravenous
contrast.
CONTRAST:  15mL MULTIHANCE GADOBENATE DIMEGLUMINE 529 MG/ML IV SOLN

[Series 5: T2 fat-sat · axial · 6.0mm · 0.31mm/px · z∈[-53,+84]mm · 5 of 20 slices shown (1 of 2)]
[im 1/20]
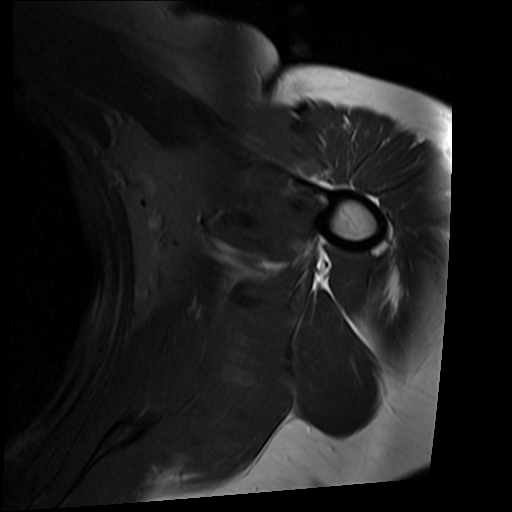
[im 5/20]
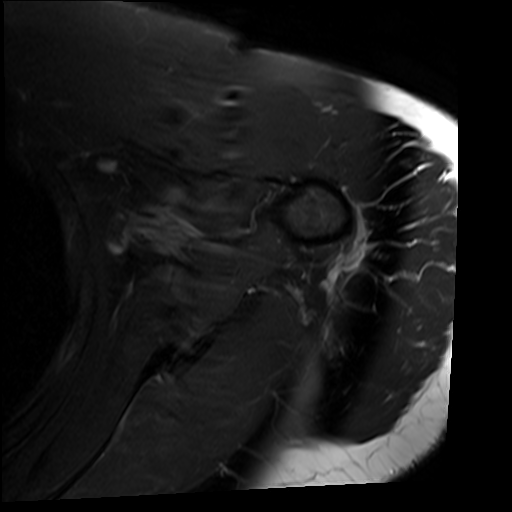
[im 10/20]
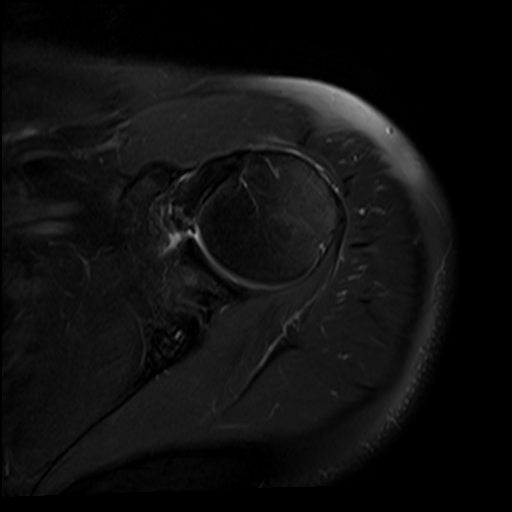
[im 15/20]
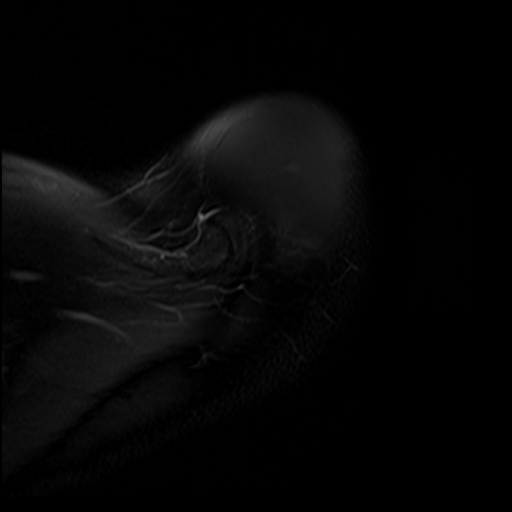
[im 20/20]
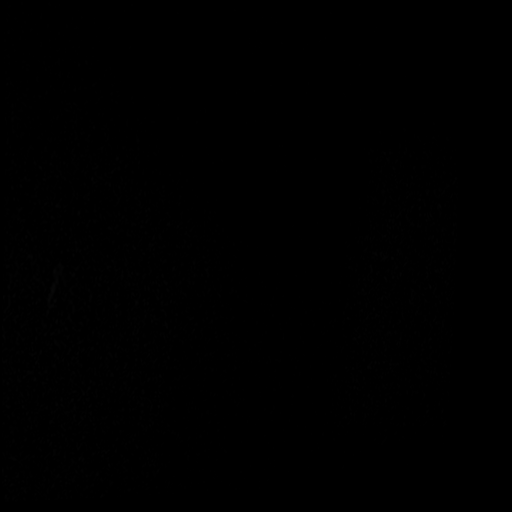

[Series 6: T1 · axial · 6.0mm · 0.39mm/px · z∈[-53,+84]mm · 4 of 20 slices shown (1 of 2)]
[im 1/20]
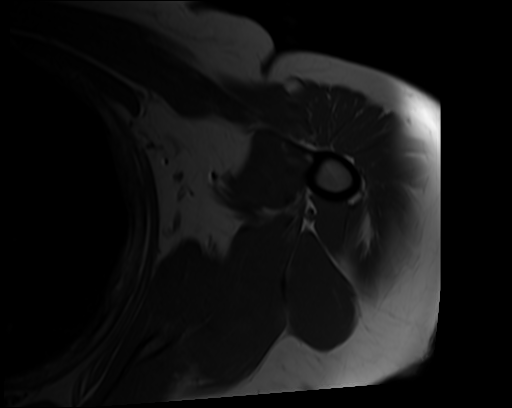
[im 7/20]
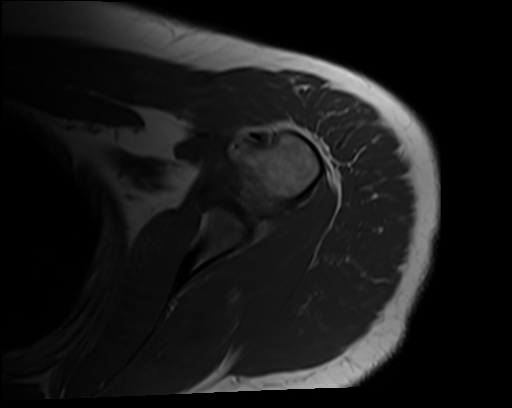
[im 13/20]
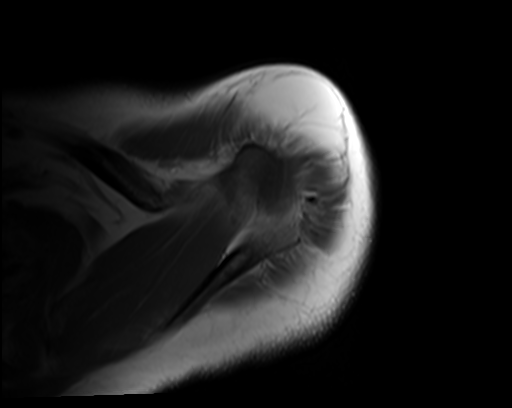
[im 20/20]
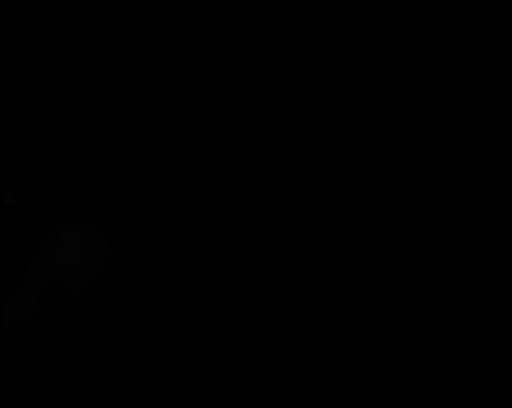

[Series 7: T1 fat-sat · axial · 6.0mm · 0.39mm/px · z∈[-53,+84]mm · 4 of 20 slices shown]
[im 1/20]
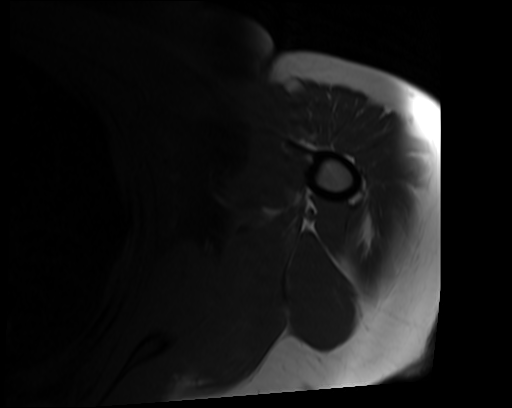
[im 7/20]
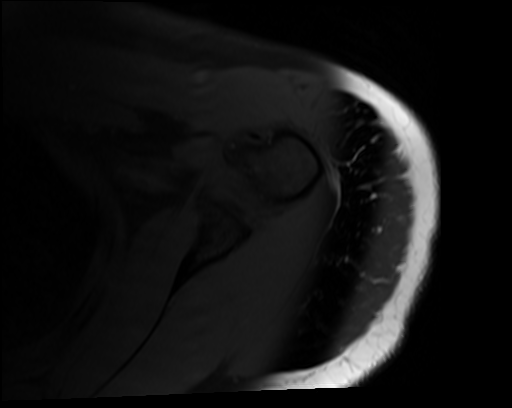
[im 13/20]
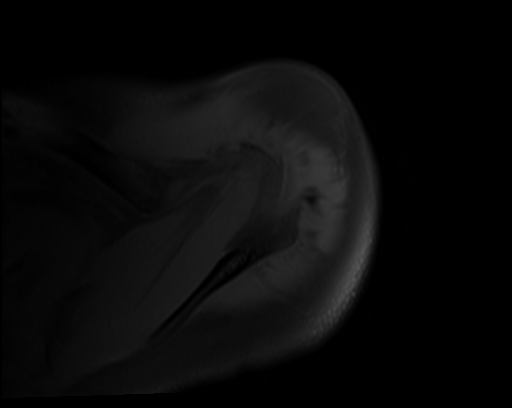
[im 20/20]
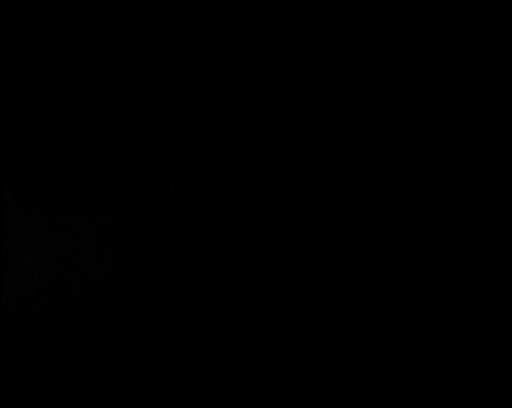

[Series 8: T1 · oblique · 4.0mm · 0.33mm/px · 5 of 32 slices shown (2 of 2)]
[im 1/32]
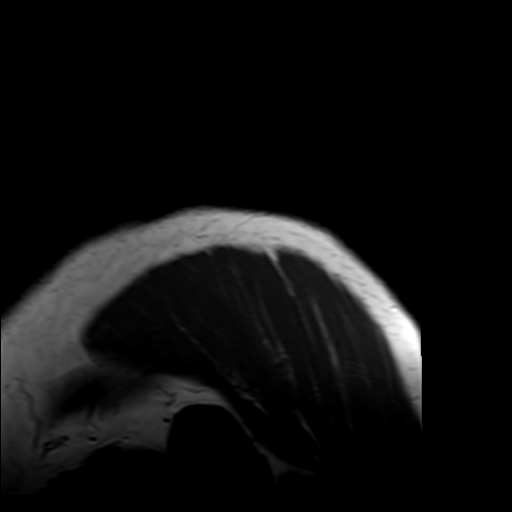
[im 7/32]
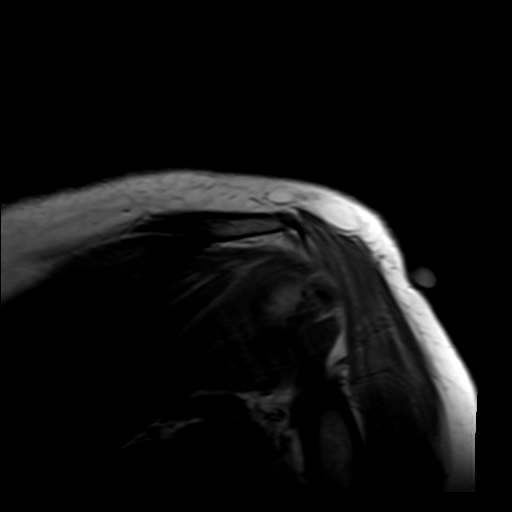
[im 13/32]
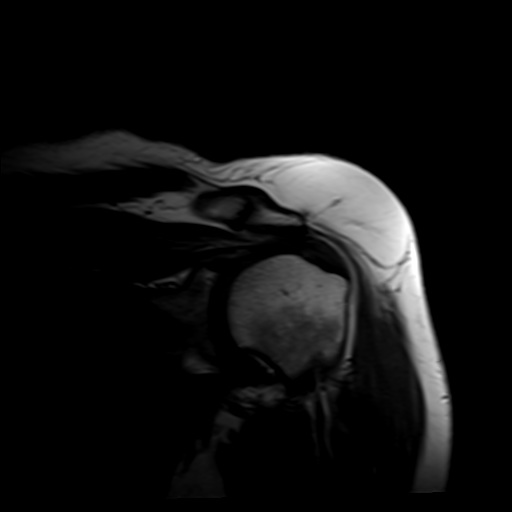
[im 19/32]
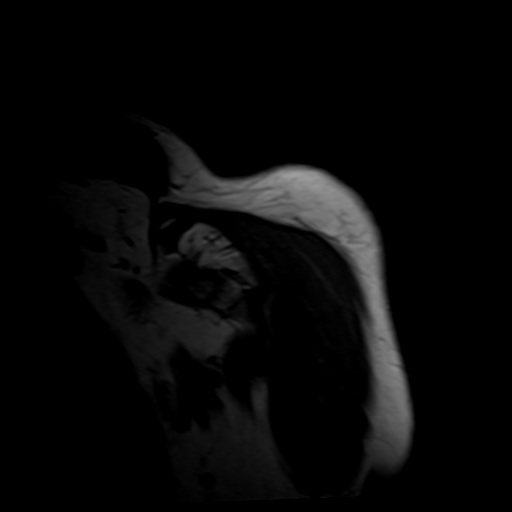
[im 25/32]
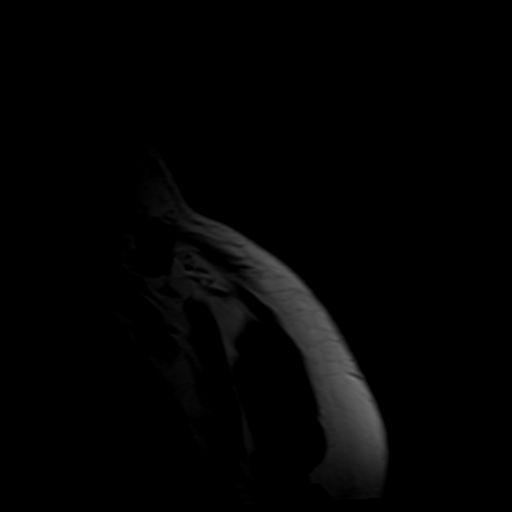

[Series 9: T2 fat-sat · oblique · 4.0mm · 0.66mm/px · 6 of 32 slices shown (2 of 2)]
[im 1/32]
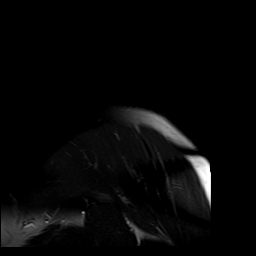
[im 7/32]
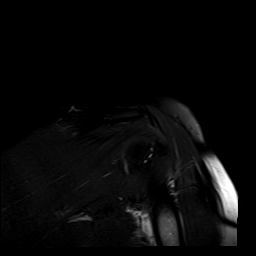
[im 13/32]
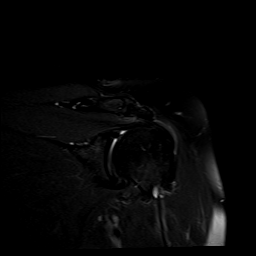
[im 19/32]
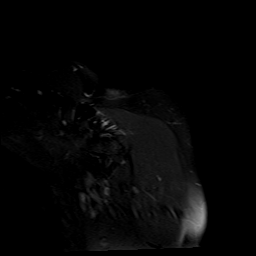
[im 25/32]
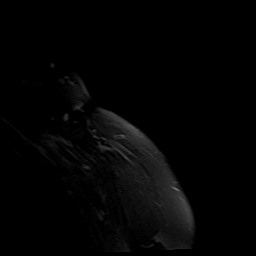
[im 32/32]
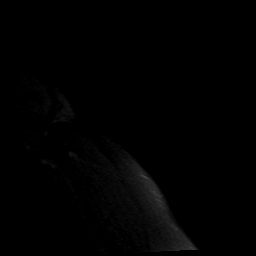

[24 of 40 positions shown; findings below may reference images not displayed]

FINDINGS: Rotator cuff: Mild tendinosis of the supraspinatus tendon with a
small partial-thickness bursal surface tear anteriorly with possible
tiny full-thickness component. Infraspinatus tendon is intact. Teres
minor tendon is intact. Subscapularis tendon is intact.

Muscles: No muscle atrophy or edema. No intramuscular fluid
collection or hematoma.

Biceps Long Head: Intraarticular and extraarticular portions of the
biceps tendon are intact.

Acromioclavicular Joint: Moderate arthropathy of the
acromioclavicular joint. Type II acromion. No subacromial/subdeltoid
bursal fluid.

Glenohumeral Joint: No joint effusion. No chondral defect.

Labrum: Possible small posterosuperior labral tear

Bones: No fracture or dislocation. No aggressive osseous lesion.

Other: 4.3 x 2.1 x 5.2 cm T1 hyperintense soft tissue mass in the
subcutaneous fat overlying the superior aspect of the left shoulder
with complete signal dropout on fat saturated images. No thickened
enhancing septations or mural nodules. Overall appearance is most
consistent with a simple lipoma.
IMPRESSION: 1. A 4.3 x 2.1 x 5.2 cm simple lipoma in the subcutaneous fat
overlying the superior aspect of the left shoulder.
2. Mild tendinosis of the supraspinatus tendon with a small
partial-thickness bursal surface tear anteriorly with possible tiny
full-thickness component.

## 2021-01-07 ENCOUNTER — Other Ambulatory Visit: Payer: Self-pay | Admitting: Family Medicine

## 2021-01-07 DIAGNOSIS — E785 Hyperlipidemia, unspecified: Secondary | ICD-10-CM

## 2021-01-07 DIAGNOSIS — I10 Essential (primary) hypertension: Secondary | ICD-10-CM

## 2021-02-11 ENCOUNTER — Other Ambulatory Visit: Payer: Self-pay

## 2021-02-11 ENCOUNTER — Ambulatory Visit: Payer: BC Managed Care – PPO | Admitting: Internal Medicine

## 2021-02-11 ENCOUNTER — Encounter: Payer: Self-pay | Admitting: Internal Medicine

## 2021-02-11 VITALS — BP 122/82 | HR 66 | Temp 98.1°F | Resp 16 | Ht 64.5 in | Wt 199.4 lb

## 2021-02-11 DIAGNOSIS — K219 Gastro-esophageal reflux disease without esophagitis: Secondary | ICD-10-CM | POA: Diagnosis not present

## 2021-02-11 DIAGNOSIS — E069 Thyroiditis, unspecified: Secondary | ICD-10-CM | POA: Diagnosis not present

## 2021-02-11 MED ORDER — PANTOPRAZOLE SODIUM 40 MG PO TBEC: 40.0000 mg | DELAYED_RELEASE_TABLET | Freq: Every day | ORAL | 1 refills | Status: DC

## 2021-02-11 NOTE — Progress Notes (Signed)
Subjective:    Patient ID: [name], female    DOB: 09-20-1965, 55 y.o.   MRN: [id number]  DOS:  02/11/2021 Type of visit - description: Acute Symptoms a started a week ago. Describe a steady discomfort at the anterior neck associated with some tenderness to palpation. Symptoms are somewhat worse when she swallows. She denies any exertional symptoms but she has noted her asthma is "acting up" lately. Describes some chest tightness that decreased with albuterol.  I ask about classic heartburn and she denies it, she did notice some increased belching lately.  No nausea or vomiting No blood in the stools No fever chills No changes in her weight Has a mild cough at baseline.    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 If you have any questions, ask your nurse or doctor.          albuterol 108 (90 Base) MCG/ACT inhaler Commonly known as: VENTOLIN HFA INHALE 1 TO 2 PUFFS INTO THE LUNGS EVERY 6 HOURS AS NEEDED FOR WHEEZING OR SHORTNESS OF BREATH.   amLODipine 5 MG tablet Commonly known as: NORVASC TAKE 1 TABLET BY MOUTH EVERY DAY   ASPIRIN 81 PO Take by mouth. Pt reports taking PRN   escitalopram 20 MG tablet Commonly known as: LEXAPRO Take 1 tablet (20 mg total) by mouth daily.   mometasone 50 MCG/ACT nasal spray Commonly known as: NASONEX Place 2 sprays into the nose daily.    scopolamine 1 MG/3DAYS Commonly known as: Transderm-Scop (1.5 MG) Place 1 patch (1.5 mg total) onto the skin every 3 (three) days.   telmisartan 80 MG tablet Commonly known as: MICARDIS TAKE 1 TABLET BY MOUTH EVERY DAY   terbinafine 250 MG tablet Commonly known as: LAMISIL Take 1 tablet by mouth daily.   Toviaz 4 MG Tb24 tablet Generic drug: fesoterodine           Objective:   Physical Exam BP 122/82 (BP Location: Left Arm, Patient Position: Sitting, Cuff Size: Small)   Pulse 66   Temp 98.1 F (36.7 C) (Oral)   Resp 16   Ht 5' 4.5" (1.638 m)   Wt 199 lb 6 oz (90.4 kg)   SpO2 97%   BMI 33.69 kg/m  General:   Well developed, NAD, BMI noted.  HEENT:  Normocephalic . Face symmetric, atraumatic Neck: No lymphadenopathies or supraclavicular mass. When she swallows, she is tender at the thyroid area although I was not able to palpate a enlarged gland or nodules. Lungs:  CTA B Normal respiratory effort, no intercostal retractions, no accessory muscle use. Heart: RRR,  no murmur.  Abdomen:  Not distended, soft, non-tender. No rebound or rigidity.   Skin: Not pale. Not jaundice Lower extremities: no pretibial edema bilaterally  Neurologic:  alert &  oriented X3.  Speech normal, gait appropriate for age and unassisted Psych--  Cognition and judgment appear intact.  Cooperative with normal attention span and concentration.  Behavior appropriate. No anxious or depressed appearing.     Assessment     55 year old female, PMH includes obesity, depression, anxiety, overactive bladder, hot flashes due to menopause, on Lamisil, presents with  Thyroiditis? Main concern today for the patient is neck pain anteriorly, she is TTP at the thyroid area.  Will r/o thyroiditis although other etiologies are possible such as GERD Plan: CBC, sed rate, TSH, free T3, free T4 Trial with pantoprazole Call if symptoms worsen. Follow-up with PCP in 1 month GERD?  See above. RTC 1  month    This visit occurred during the SARS-CoV-2 public health emergency.  Safety protocols were in place, including screening questions prior to the visit, additional usage of staff PPE, and extensive cleaning of exam room while observing appropriate contact time as indicated for disinfecting solutions.

## 2021-02-11 NOTE — Patient Instructions (Addendum)
Happy Birthday!   Start pantoprazole 1 tablet in the morning before breakfast   GO TO THE LAB : Get the blood work     [place], [facility] back for a checkup in 4 weeks with your primary doctor

## 2021-02-12 LAB — CBC WITH DIFFERENTIAL/PLATELET
Basophils Absolute: 0.1 10*3/uL (ref 0.0–0.1)
Basophils Relative: 1.1 % (ref 0.0–3.0)
Eosinophils Absolute: 0.1 10*3/uL (ref 0.0–0.7)
Eosinophils Relative: 2 % (ref 0.0–5.0)
HCT: 38.7 % (ref 36.0–46.0)
Hemoglobin: 13 g/dL (ref 12.0–15.0)
Lymphocytes Relative: 36.5 % (ref 12.0–46.0)
Lymphs Abs: 1.9 10*3/uL (ref 0.7–4.0)
MCHC: 33.6 g/dL (ref 30.0–36.0)
MCV: 87.2 fl (ref 78.0–100.0)
Monocytes Absolute: 0.4 10*3/uL (ref 0.1–1.0)
Monocytes Relative: 7.5 % (ref 3.0–12.0)
Neutro Abs: 2.8 10*3/uL (ref 1.4–7.7)
Neutrophils Relative %: 52.9 % (ref 43.0–77.0)
Platelets: 274 10*3/uL (ref 150.0–400.0)
RBC: 4.44 Mil/uL (ref 3.87–5.11)
RDW: 13.5 % (ref 11.5–15.5)
WBC: 5.2 10*3/uL (ref 4.0–10.5)

## 2021-02-12 LAB — T4, FREE: Free T4: 0.86 ng/dL (ref 0.60–1.60)

## 2021-02-12 LAB — T3, FREE: T3, Free: 2.5 pg/mL (ref 2.3–4.2)

## 2021-02-12 LAB — TSH: TSH: 1.48 u[IU]/mL (ref 0.35–5.50)

## 2021-02-12 LAB — SEDIMENTATION RATE: Sed Rate: 8 mm/hr (ref 0–30)

## 2021-03-11 ENCOUNTER — Ambulatory Visit: Payer: BC Managed Care – PPO | Admitting: Family Medicine

## 2021-03-11 ENCOUNTER — Other Ambulatory Visit: Payer: Self-pay

## 2021-03-11 ENCOUNTER — Encounter: Payer: Self-pay | Admitting: Family Medicine

## 2021-03-11 VITALS — BP 112/78 | HR 71 | Temp 98.8°F | Resp 18 | Ht 64.5 in | Wt 202.2 lb

## 2021-03-11 DIAGNOSIS — I10 Essential (primary) hypertension: Secondary | ICD-10-CM | POA: Diagnosis not present

## 2021-03-11 DIAGNOSIS — H9193 Unspecified hearing loss, bilateral: Secondary | ICD-10-CM

## 2021-03-11 LAB — COMPREHENSIVE METABOLIC PANEL
ALT: 9 U/L (ref 0–35)
AST: 16 U/L (ref 0–37)
Albumin: 4.3 g/dL (ref 3.5–5.2)
Alkaline Phosphatase: 37 U/L — ABNORMAL LOW (ref 39–117)
BUN: 18 mg/dL (ref 6–23)
CO2: 31 mEq/L (ref 19–32)
Calcium: 9.2 mg/dL (ref 8.4–10.5)
Chloride: 101 mEq/L (ref 96–112)
Creatinine, Ser: 1.37 mg/dL — ABNORMAL HIGH (ref 0.40–1.20)
GFR: 44.22 mL/min — ABNORMAL LOW (ref >60.00)
Glucose, Bld: 81 mg/dL (ref 70–99)
Potassium: 4.2 mEq/L (ref 3.5–5.1)
Sodium: 138 mEq/L (ref 135–145)
Total Bilirubin: 0.5 mg/dL (ref 0.2–1.2)
Total Protein: 6.8 g/dL (ref 6.0–8.3)

## 2021-03-11 LAB — LIPID PANEL
Cholesterol: 190 mg/dL (ref 0–200)
HDL: 57.2 mg/dL (ref >39.00)
LDL Cholesterol: 120 mg/dL — ABNORMAL HIGH (ref 0–99)
NonHDL: 132.96
Total CHOL/HDL Ratio: 3
Triglycerides: 63 mg/dL (ref 0.0–149.0)
VLDL: 12.6 mg/dL (ref 0.0–40.0)

## 2021-03-11 NOTE — Patient Instructions (Signed)

## 2021-03-11 NOTE — Progress Notes (Signed)
Established Patient Office Visit  Subjective:  Patient ID: [name], female    DOB: March 23, 1966  Age: 55 y.o. MRN: [id number]  CC:  Chief Complaint  Patient presents with   Hypertension   Follow-up    HPI [name] presents for f/u bp and labs.   No complaints except she feels like her hearing is going bad.   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   bilateral mammography and left breast U/S in 6 months   Hypertension    Menorrhagia     Past Surgical History:  Procedure Laterality Date   LIPOMA EXCISION Left 04/03/2020   L shoulder    ROTATOR CUFF REPAIR Right 04/03/2020   Dr Tamera Punt     Family History  Problem Relation Age of Onset   Hypertension Mother    Arthritis Mother    Heart attack Father    Hypertension Father    Diabetes Father    Heart attack Paternal Grandfather 74    Social History   Socioeconomic History   Marital status: Married    Spouse name: Not on file   Number of children: Not on file   Years of education: Not on file   Highest education level: Not on file  Occupational History   Not on file  Tobacco Use   Smoking status: Never   Smokeless tobacco: Never  Vaping Use   Vaping Use: Never used  Substance and Sexual Activity   Alcohol use: No   Drug use: No   Sexual activity: Not on file  Other Topics Concern   Not on file  Social History Narrative   Not on file   Social Determinants of Health   Financial Resource Strain: Not on file  Food Insecurity: Not on file  Transportation Needs: Not on file  Physical Activity: Not on file  Stress: Not on file  Social Connections: Not on file  Intimate Partner Violence: Not on file    Outpatient Medications Prior to Visit  Medication Sig Dispense Refill   albuterol (VENTOLIN HFA) 108 (90 Base) MCG/ACT inhaler INHALE 1 TO 2 PUFFS INTO THE LUNGS EVERY 6 HOURS AS NEEDED FOR WHEEZING OR SHORTNESS OF BREATH. 18 each 3   amLODipine (NORVASC)  5 MG tablet TAKE 1 TABLET BY MOUTH EVERY DAY 90 tablet 2   ASPIRIN 81 PO Take by mouth. Pt reports taking PRN     escitalopram (LEXAPRO) 20 MG tablet Take 1 tablet (20 mg total) by mouth daily. 90 tablet 2   mometasone (NASONEX) 50 MCG/ACT nasal spray Place 2 sprays into the nose daily.     pantoprazole (PROTONIX) 40 MG tablet Take 1 tablet (40 mg total) by mouth daily before breakfast. 30 tablet 1   telmisartan (MICARDIS) 80 MG tablet TAKE 1 TABLET BY MOUTH EVERY DAY 90 tablet 1   terbinafine (LAMISIL) 250 MG tablet Take 1 tablet by mouth daily.     TOVIAZ 4 MG TB24 tablet      scopolamine (TRANSDERM-SCOP, 1.5 MG,) 1 MG/3DAYS Place 1 patch (1.5 mg total) onto the skin every 3 (three) days. (Patient not taking: Reported on 02/11/2021) 5 patch 0   No facility-administered medications prior to visit.    Allergies  Allergen Reactions   Tramadol Nausea And Vomiting   Amethyst [Levonorgestrel-Ethinyl Estrad] Palpitations    Other symptoms--slurred speech,headache,dizziness PG CMA    ROS Review of Systems  Constitutional:  Negative for appetite change, diaphoresis, fatigue  and unexpected weight change.  Eyes:  Negative for pain, redness and visual disturbance.  Respiratory:  Negative for cough, chest tightness, shortness of breath and wheezing.   Cardiovascular:  Negative for chest pain, palpitations and leg swelling.  Endocrine: Negative for cold intolerance, heat intolerance, polydipsia, polyphagia and polyuria.  Genitourinary:  Negative for difficulty urinating, dysuria and frequency.  Neurological:  Negative for dizziness, light-headedness, numbness and headaches.     Objective:    Physical Exam Vitals and nursing note reviewed.  Constitutional:      Appearance: She is well-developed.  HENT:     Head: Normocephalic and atraumatic.     Right Ear: Tympanic membrane, ear canal and external ear normal. There is no impacted cerumen.     Left Ear: Tympanic membrane, ear canal and  external ear normal. There is no impacted cerumen.  Eyes:     Conjunctiva/sclera: Conjunctivae normal.  Neck:     Thyroid: No thyromegaly.     Vascular: No carotid bruit or JVD.  Cardiovascular:     Rate and Rhythm: Normal rate and regular rhythm.     Heart sounds: Normal heart sounds. No murmur heard. Pulmonary:     Effort: Pulmonary effort is normal. No respiratory distress.     Breath sounds: Normal breath sounds. No wheezing or rales.  Chest:     Chest wall: No tenderness.  Musculoskeletal:     Cervical back: Normal range of motion and neck supple.  Neurological:     General: No focal deficit present.     Mental Status: She is alert and oriented to person, place, and time.  Psychiatric:        Mood and Affect: Mood normal.        Behavior: Behavior normal.        Thought Content: Thought content normal.        Judgment: Judgment normal.    BP 112/78 (BP Location: Right Arm, Patient Position: Sitting, Cuff Size: Large)   Pulse 71   Temp 98.8 F (37.1 C) (Oral)   Resp 18   Ht 5' 4.5" (1.638 m)   Wt 202 lb 3.2 oz (91.7 kg)   SpO2 95%   BMI 34.17 kg/m  Wt Readings from Last 3 Encounters:  03/11/21 202 lb 3.2 oz (91.7 kg)  02/11/21 199 lb 6 oz (90.4 kg)  07/24/20 195 lb 12.8 oz (88.8 kg)     Health Maintenance Due  Topic Date Due   HIV Screening  Never done   COVID-19 Vaccine (4 - Booster for Pfizer series) 04/23/2020   PAP SMEAR-Modifier  04/01/2021    There are no preventive care reminders to display for this patient.  Lab Results  Component Value Date   TSH 1.48 02/11/2021   Lab Results  Component Value Date   WBC 5.2 02/11/2021   HGB 13.0 02/11/2021   HCT 38.7 02/11/2021   MCV 87.2 02/11/2021   PLT 274.0 02/11/2021   Lab Results  Component Value Date   NA 137 07/24/2020   K 4.5 07/24/2020   CO2 30 07/24/2020   GLUCOSE 81 07/24/2020   BUN 15 07/24/2020   CREATININE 1.23 (H) 07/24/2020   BILITOT 0.7 07/24/2020   ALKPHOS 40 07/24/2020   AST 15  07/24/2020   ALT 7 07/24/2020   PROT 6.7 07/24/2020   ALBUMIN 4.2 07/24/2020   CALCIUM 9.2 07/24/2020   ANIONGAP 7 08/22/2015   GFR 50.55 (L) 07/24/2020   Lab Results  Component Value Date  CHOL 191 07/24/2020   Lab Results  Component Value Date   HDL 55.50 07/24/2020   Lab Results  Component Value Date   LDLCALC 121 (H) 07/24/2020   Lab Results  Component Value Date   TRIG 73.0 07/24/2020   Lab Results  Component Value Date   CHOLHDL 3 07/24/2020   No results found for: HGBA1C    Assessment & Plan:   Problem List Items Addressed This Visit       Unprioritized   Essential hypertension, benign    Well controlled, no changes to meds. Encouraged heart healthy diet such as the DASH diet and exercise as tolerated.        Other Visit Diagnoses     Primary hypertension    -  Primary   Relevant Orders   Lipid panel   Comprehensive metabolic panel   Bilateral hearing loss, unspecified hearing loss type       Relevant Orders   Ambulatory referral to Audiology       No orders of the defined types were placed in this encounter.   Follow-up: Return in about 6 months (around 09/08/2021), or if symptoms worsen or fail to improve, for annual exam, fasting.    Ann Held, DO

## 2021-03-11 NOTE — Assessment & Plan Note (Signed)
Well controlled, no changes to meds. Encouraged heart healthy diet such as the DASH diet and exercise as tolerated.  °

## 2021-03-12 ENCOUNTER — Other Ambulatory Visit: Payer: Self-pay | Admitting: Family Medicine

## 2021-03-12 DIAGNOSIS — I10 Essential (primary) hypertension: Secondary | ICD-10-CM

## 2021-03-13 ENCOUNTER — Other Ambulatory Visit: Payer: Self-pay

## 2021-03-13 MED ORDER — TELMISARTAN 40 MG PO TABS: 40.0000 mg | ORAL_TABLET | Freq: Every day | ORAL | 2 refills | Status: DC

## 2021-04-02 ENCOUNTER — Ambulatory Visit: Payer: BC Managed Care – PPO | Attending: Family Medicine | Admitting: Audiologist

## 2021-04-02 ENCOUNTER — Other Ambulatory Visit: Payer: Self-pay

## 2021-04-02 DIAGNOSIS — H9193 Unspecified hearing loss, bilateral: Secondary | ICD-10-CM | POA: Diagnosis not present

## 2021-04-02 NOTE — Procedures (Signed)
°  Outpatient Audiology and [facility] [place],   [id number] [phone]  AUDIOLOGICAL  EVALUATION  NAME: [name]     DOB:   1966/03/28      MRN: [id number]                                                                                     DATE: 04/02/2021     REFERENT: Ann Held, DO STATUS: Outpatient DIAGNOSIS: Decreased Hearing    History: [name] , 55 y.o. , was seen for an audiological evaluation due to difficulty hearing in background noise. She says she cannot hear when people are talking on the TV but can hear the music. She has difficulty hearing conversations when there is background noise. She finds herself asking people to repeat themselves often. She has no history of recent ear infections. She has had excessive noise exposure. History is negative for any conditions that are risk factors for hearing loss. She denied any ringing or buzzing in the ears. She has no pain or pressure in either ear. No other case history reported.   Evaluation:  Otoscopy showed a clear view of the tympanic membranes, bilaterally Tympanometry results were consistent with normal middle ear function bilaterally   Audiometric testing was completed using Conventional Audiometry techniques over insert transducer. Test results are consistent with normal hearing 250-8k Hz in both ears. Speech detection thresholds 15dB in the right ear and 15dB in the left ear. Word recognition with a Nu6 list was good in both ears at 40dB SL. Quick Speech in Noise test showed 5dB SNR loss showing mild difficulty hearing in noise.    Results:  The test results were reviewed with  [name] . Hearing is normal in both ears. [name] did have mild difficulty on the speech in noise testing. There is no indication of hearing loss at this time.    Recommendations: 1.   No further audiologic testing is needed unless future hearing concerns arise.    Alfonse Alpers  Audiologist,  Au.D., CCC-A

## 2021-04-03 ENCOUNTER — Other Ambulatory Visit (INDEPENDENT_AMBULATORY_CARE_PROVIDER_SITE_OTHER): Payer: BC Managed Care – PPO

## 2021-04-03 DIAGNOSIS — I10 Essential (primary) hypertension: Secondary | ICD-10-CM | POA: Diagnosis not present

## 2021-04-03 LAB — COMPREHENSIVE METABOLIC PANEL
ALT: 8 U/L (ref 0–35)
AST: 15 U/L (ref 0–37)
Albumin: 4.2 g/dL (ref 3.5–5.2)
Alkaline Phosphatase: 41 U/L (ref 39–117)
BUN: 16 mg/dL (ref 6–23)
CO2: 33 mEq/L — ABNORMAL HIGH (ref 19–32)
Calcium: 9.2 mg/dL (ref 8.4–10.5)
Chloride: 103 mEq/L (ref 96–112)
Creatinine, Ser: 1.17 mg/dL (ref 0.40–1.20)
GFR: 53.41 mL/min — ABNORMAL LOW (ref >60.00)
Glucose, Bld: 70 mg/dL (ref 70–99)
Potassium: 4 mEq/L (ref 3.5–5.1)
Sodium: 141 mEq/L (ref 135–145)
Total Bilirubin: 0.6 mg/dL (ref 0.2–1.2)
Total Protein: 6.9 g/dL (ref 6.0–8.3)

## 2021-04-03 LAB — LIPID PANEL
Cholesterol: 191 mg/dL (ref 0–200)
HDL: 64.2 mg/dL (ref >39.00)
LDL Cholesterol: 105 mg/dL — ABNORMAL HIGH (ref 0–99)
NonHDL: 126.76
Total CHOL/HDL Ratio: 3
Triglycerides: 109 mg/dL (ref 0.0–149.0)
VLDL: 21.8 mg/dL (ref 0.0–40.0)

## 2021-04-08 ENCOUNTER — Other Ambulatory Visit: Payer: Self-pay | Admitting: Internal Medicine

## 2021-04-09 ENCOUNTER — Ambulatory Visit: Payer: BC Managed Care – PPO | Attending: Internal Medicine

## 2021-04-09 DIAGNOSIS — Z23 Encounter for immunization: Secondary | ICD-10-CM

## 2021-04-09 NOTE — Progress Notes (Signed)
° °  Cov[facility] Vaccination [facility]  Name:  [name]    MRN: [id number] DOB: [birth year]  04/09/2021  [name] was observed post Covid-19 immunization for 15 minutes without incident. She was provided with Vaccine Information Sheet and instruction to access the V-Safe system.   [name] was instructed to call 911 with any severe reactions post vaccine: Difficulty breathing  Swelling of face and throat  A fast heartbeat  A bad rash all over body  Dizziness and weakness   Immunizations Administered     Name Date Dose VIS Date Route   Pfizer Covid-19 Vaccine Bivalent Booster 04/09/2021  2:10 PM 0.3 mL 12/19/2020 Intramuscular   Manufacturer: [place]   Lot: [id number]   [place]: [phone]

## 2021-04-11 ENCOUNTER — Other Ambulatory Visit (HOSPITAL_BASED_OUTPATIENT_CLINIC_OR_DEPARTMENT_OTHER): Payer: Self-pay

## 2021-04-11 MED ORDER — PFIZER COVID-19 VAC BIVALENT 30 MCG/0.3ML IM SUSP
INTRAMUSCULAR | 0 refills | Status: DC
  Filled 2021-04-11: qty 0.3, 1d supply, fill #0

## 2021-06-05 ENCOUNTER — Encounter: Payer: Self-pay | Admitting: Family Medicine

## 2021-06-06 ENCOUNTER — Other Ambulatory Visit: Payer: Self-pay | Admitting: Family Medicine

## 2021-06-06 DIAGNOSIS — T753XXD Motion sickness, subsequent encounter: Secondary | ICD-10-CM

## 2021-06-06 MED ORDER — SCOPOLAMINE 1 MG/3DAYS TD PT72: 1.0000 | MEDICATED_PATCH | TRANSDERMAL | 12 refills | Status: DC

## 2021-06-08 ENCOUNTER — Other Ambulatory Visit: Payer: Self-pay | Admitting: Family Medicine

## 2021-07-06 ENCOUNTER — Other Ambulatory Visit: Payer: Self-pay | Admitting: Family Medicine

## 2021-07-06 DIAGNOSIS — F419 Anxiety disorder, unspecified: Secondary | ICD-10-CM

## 2021-07-08 NOTE — Telephone Encounter (Signed)
Requesting: Contract: UDS: Last Visit: Next Visit: Last Refill:  Please Advise  

## 2021-07-29 ENCOUNTER — Ambulatory Visit (INDEPENDENT_AMBULATORY_CARE_PROVIDER_SITE_OTHER): Payer: BC Managed Care – PPO | Admitting: Family Medicine

## 2021-07-29 ENCOUNTER — Telehealth: Payer: Self-pay

## 2021-07-29 ENCOUNTER — Encounter: Payer: Self-pay | Admitting: Family Medicine

## 2021-07-29 VITALS — BP 110/70 | HR 83 | Temp 98.7°F | Resp 18 | Ht 64.5 in | Wt 204.8 lb

## 2021-07-29 DIAGNOSIS — Z13220 Encounter for screening for lipoid disorders: Secondary | ICD-10-CM

## 2021-07-29 DIAGNOSIS — I10 Essential (primary) hypertension: Secondary | ICD-10-CM

## 2021-07-29 DIAGNOSIS — Z6832 Body mass index (BMI) 32.0-32.9, adult: Secondary | ICD-10-CM

## 2021-07-29 DIAGNOSIS — E6609 Other obesity due to excess calories: Secondary | ICD-10-CM

## 2021-07-29 DIAGNOSIS — Z0000 Encounter for general adult medical examination without abnormal findings: Secondary | ICD-10-CM | POA: Insufficient documentation

## 2021-07-29 DIAGNOSIS — Z889 Allergy status to unspecified drugs, medicaments and biological substances status: Secondary | ICD-10-CM

## 2021-07-29 DIAGNOSIS — N951 Menopausal and female climacteric states: Secondary | ICD-10-CM

## 2021-07-29 LAB — COMPREHENSIVE METABOLIC PANEL
ALT: 8 U/L (ref 0–35)
AST: 16 U/L (ref 0–37)
Albumin: 4.2 g/dL (ref 3.5–5.2)
Alkaline Phosphatase: 36 U/L — ABNORMAL LOW (ref 39–117)
BUN: 12 mg/dL (ref 6–23)
CO2: 30 mEq/L (ref 19–32)
Calcium: 8.8 mg/dL (ref 8.4–10.5)
Chloride: 102 mEq/L (ref 96–112)
Creatinine, Ser: 1.13 mg/dL (ref 0.40–1.20)
GFR: 55.56 mL/min — ABNORMAL LOW (ref >60.00)
Glucose, Bld: 85 mg/dL (ref 70–99)
Potassium: 4.1 mEq/L (ref 3.5–5.1)
Sodium: 138 mEq/L (ref 135–145)
Total Bilirubin: 0.5 mg/dL (ref 0.2–1.2)
Total Protein: 6.4 g/dL (ref 6.0–8.3)

## 2021-07-29 LAB — CBC WITH DIFFERENTIAL/PLATELET
Basophils Absolute: 0 10*3/uL (ref 0.0–0.1)
Basophils Relative: 1 % (ref 0.0–3.0)
Eosinophils Absolute: 0.1 10*3/uL (ref 0.0–0.7)
Eosinophils Relative: 1.6 % (ref 0.0–5.0)
HCT: 38.1 % (ref 36.0–46.0)
Hemoglobin: 12.6 g/dL (ref 12.0–15.0)
Lymphocytes Relative: 39.1 % (ref 12.0–46.0)
Lymphs Abs: 1.7 10*3/uL (ref 0.7–4.0)
MCHC: 32.9 g/dL (ref 30.0–36.0)
MCV: 88.3 fl (ref 78.0–100.0)
Monocytes Absolute: 0.3 10*3/uL (ref 0.1–1.0)
Monocytes Relative: 6.9 % (ref 3.0–12.0)
Neutro Abs: 2.2 10*3/uL (ref 1.4–7.7)
Neutrophils Relative %: 51.4 % (ref 43.0–77.0)
Platelets: 262 10*3/uL (ref 150.0–400.0)
RBC: 4.32 Mil/uL (ref 3.87–5.11)
RDW: 14.1 % (ref 11.5–15.5)
WBC: 4.4 10*3/uL (ref 4.0–10.5)

## 2021-07-29 LAB — LIPID PANEL
Cholesterol: 194 mg/dL (ref 0–200)
HDL: 63.4 mg/dL (ref >39.00)
LDL Cholesterol: 120 mg/dL — ABNORMAL HIGH (ref 0–99)
NonHDL: 130.72
Total CHOL/HDL Ratio: 3
Triglycerides: 54 mg/dL (ref 0.0–149.0)
VLDL: 10.8 mg/dL (ref 0.0–40.0)

## 2021-07-29 LAB — TSH: TSH: 1.66 u[IU]/mL (ref 0.35–5.50)

## 2021-07-29 MED ORDER — WEGOVY 0.5 MG/0.5ML ~~LOC~~ SOAJ: 0.5000 mg | SUBCUTANEOUS | 1 refills | Status: DC

## 2021-07-29 NOTE — Progress Notes (Signed)
? ?Subjective:  ? ?By signing my name below, I, [name], attest that this documentation has been prepared under the direction and in the presence of Roma Schanz DO, 07/29/2021 ? ? Patient ID: [name], female    DOB: 03-19-1966, 56 y.o.   MRN: [id number] ? ?Chief Complaint  ?Patient presents with  ? Annual Exam  ?  Pt states fasting   ? ? ?HPI ?Patient is in today for a comprehensive physical exam. ? ?She is inquiring about her medications. She is interested in taking Tumeric for her joint pain. ? ?She has been taking 20 MG of Escitalopram. The medication has been helping her hot flashes. She states that the medication is increasing her appetite. ? ?She is interested in receiving an allergenic test. She has been getting symptoms that are similar to an allergic reaction, however, she does not know what's causing it.  ? ?As of today's visit, her blood pressure is fine. ?BP Readings from Last 3 Encounters:  ?07/29/21 110/70  ?03/11/21 112/78  ?02/11/21 122/82  ? ?Her weight has been elevating. She's interested in receiving 0.5 MG of Wegovy. ?Wt Readings from Last 3 Encounters:  ?07/29/21 204 lb 12.8 oz (92.9 kg)  ?03/11/21 202 lb 3.2 oz (91.7 kg)  ?02/11/21 199 lb 6 oz (90.4 kg)  ? ?She is taking her medications as prescribed.  ? ?She denies having any fever, ear pain, new muscle pain, new moles, congestion, sinus pain, sore throat, palpations, wheezing, n/v/d, constipation, blood in stool, dysuria, frequency, hematuria at this time. ? ?She reports no changes to her family history. She denies of any new recent surgeries.  ?Mammogram last completed on 06/18/2015 ?Her appetite has been increasing. ?She has been exercising regularly. She has a Physiological scientist.  ? ?She is UTD on vision exams.  ? ? ?Past Medical History:  ?Diagnosis Date  ? 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 bilateral mammography and left breast U/S in 6 months  ? Hypertension   ? Menorrhagia   ? ? ?Past Surgical History:   ?Procedure Laterality Date  ? LIPOMA EXCISION Left 04/03/2020  ? L shoulder   ? ROTATOR CUFF REPAIR Right 04/03/2020  ? Dr Tamera Punt   ? ? ?Family History  ?Problem Relation Age of Onset  ? Hypertension Mother   ? Arthritis Mother   ? Heart attack Father   ? Hypertension Father   ? Diabetes Father   ? Heart attack Paternal Grandfather 70  ? ? ?Social History  ? ?Socioeconomic History  ? Marital status: Married  ?  Spouse name: Not on file  ? Number of children: Not on file  ? Years of education: Not on file  ? Highest education level: Not on file  ?Occupational History  ? Not on file  ?Tobacco Use  ? Smoking status: Never  ? Smokeless tobacco: Never  ?Vaping Use  ? Vaping Use: Never used  ?Substance and Sexual Activity  ? Alcohol use: No  ? Drug use: No  ? Sexual activity: Not on file  ?Other Topics Concern  ? Not on file  ?Social History Narrative  ? Exercising --- working with trainer   ? ?Social Determinants of Health  ? ?Financial Resource Strain: Not on file  ?Food Insecurity: Not on file  ?Transportation Needs: Not on file  ?Physical Activity: Not on file  ?Stress: Not on file  ?Social Connections: Not on file  ?Intimate Partner Violence: Not on file  ? ? ?Outpatient Medications Prior to Visit  ?  Medication Sig Dispense Refill  ? albuterol (VENTOLIN HFA) 108 (90 Base) MCG/ACT inhaler INHALE 1 TO 2 PUFFS INTO THE LUNGS EVERY 6 HOURS AS NEEDED FOR WHEEZING OR SHORTNESS OF BREATH. 18 each 3  ? amLODipine (NORVASC) 5 MG tablet TAKE 1 TABLET BY MOUTH EVERY DAY 90 tablet 2  ? ASPIRIN 81 PO Take by mouth. Pt reports taking PRN    ? escitalopram (LEXAPRO) 20 MG tablet TAKE 1 TABLET BY MOUTH EVERY DAY 90 tablet 2  ? mometasone (NASONEX) 50 MCG/ACT nasal spray Place 2 sprays into the nose daily.    ? scopolamine (TRANSDERM-SCOP) 1 MG/3DAYS Place 1 patch (1.5 mg total) onto the skin every 3 (three) days. 10 patch 12  ? telmisartan (MICARDIS) 40 MG tablet TAKE 1 TABLET BY MOUTH EVERY DAY 30 tablet 2  ? terbinafine  (LAMISIL) 250 MG tablet Take 1 tablet by mouth daily.    ? TOVIAZ 4 MG TB24 tablet     ? COVID-19 mRNA bivalent vaccine, Pfizer, (PFIZER COVID-19 VAC BIVALENT) injection Inject into the muscle. 0.3 mL 0  ? pantoprazole (PROTONIX) 40 MG tablet TAKE 1 TABLET BY MOUTH DAILY BEFORE BREAKFAST 90 tablet 0  ? ?No facility-administered medications prior to visit.  ? ? ?Allergies  ?Allergen Reactions  ? Tramadol Nausea And Vomiting  ? Amethyst [Levonorgestrel-Ethinyl Estrad] Palpitations  ?  Other symptoms--slurred speech,headache,dizziness PG CMA  ? ? ?Review of Systems  ?Constitutional:  Negative for fever.  ?HENT:  Negative for congestion, ear pain, sinus pain and sore throat.   ?Respiratory:  Negative for wheezing.   ?Cardiovascular:  Negative for palpitations.  ?Gastrointestinal:  Negative for blood in stool, constipation, diarrhea, nausea and vomiting.  ?Genitourinary:  Negative for dysuria, frequency and hematuria.  ?Musculoskeletal:  Positive for joint pain. Negative for myalgias.  ?Skin:   ?     (-) [place]  ? ?   ?Objective:  ?  ?Physical Exam ?Constitutional:   ?   General: She is not in acute distress. ?   Appearance: Normal appearance. She is not ill-appearing.  ?HENT:  ?   Head: Normocephalic and atraumatic.  ?   Right Ear: Tympanic membrane, ear canal and external ear normal.  ?   Left Ear: Tympanic membrane, ear canal and external ear normal.  ?Eyes:  ?   Extraocular Movements: Extraocular movements intact.  ?   Pupils: Pupils are equal, round, and reactive to light.  ?Cardiovascular:  ?   Rate and Rhythm: Normal rate and regular rhythm.  ?   Heart sounds: Normal heart sounds. No murmur heard. ?  No gallop.  ?Pulmonary:  ?   Effort: Pulmonary effort is normal. No respiratory distress.  ?   Breath sounds: Normal breath sounds. No wheezing or rales.  ?Abdominal:  ?   General: Bowel sounds are normal. There is no distension.  ?   Palpations: Abdomen is soft.  ?   Tenderness: There is no abdominal tenderness.  There is no guarding.  ?Skin: ?   General: Skin is warm and dry.  ?Neurological:  ?   Mental Status: She is alert and oriented to person, place, and time.  ?Psychiatric:     ?   Judgment: Judgment normal.  ? ? ?BP 110/70 (BP Location: Left Arm, Patient Position: Sitting, Cuff Size: Normal)   Pulse 83   Temp 98.7 ?F (37.1 ?C) (Oral)   Resp 18   Ht 5' 4.5" (1.638 m)   Wt 204 lb 12.8 oz (92.9 kg)  SpO2 95%   BMI 34.61 kg/m?  ?Wt Readings from Last 3 Encounters:  ?07/29/21 204 lb 12.8 oz (92.9 kg)  ?03/11/21 202 lb 3.2 oz (91.7 kg)  ?02/11/21 199 lb 6 oz (90.4 kg)  ? ? ?Diabetic Foot Exam - Simple   ?No data filed ?  ? ?Lab Results  ?Component Value Date  ? WBC 5.2 02/11/2021  ? HGB 13.0 02/11/2021  ? HCT 38.7 02/11/2021  ? PLT 274.0 02/11/2021  ? GLUCOSE 70 04/03/2021  ? CHOL 191 04/03/2021  ? TRIG 109.0 04/03/2021  ? HDL 64.20 04/03/2021  ? LDLCALC 105 (H) 04/03/2021  ? ALT 8 04/03/2021  ? AST 15 04/03/2021  ? NA 141 04/03/2021  ? K 4.0 04/03/2021  ? CL 103 04/03/2021  ? CREATININE 1.17 04/03/2021  ? BUN 16 04/03/2021  ? CO2 33 (H) 04/03/2021  ? TSH 1.48 02/11/2021  ? ? ?Lab Results  ?Component Value Date  ? TSH 1.48 02/11/2021  ? ?Lab Results  ?Component Value Date  ? WBC 5.2 02/11/2021  ? HGB 13.0 02/11/2021  ? HCT 38.7 02/11/2021  ? MCV 87.2 02/11/2021  ? PLT 274.0 02/11/2021  ? ?Lab Results  ?Component Value Date  ? NA 141 04/03/2021  ? K 4.0 04/03/2021  ? CO2 33 (H) 04/03/2021  ? GLUCOSE 70 04/03/2021  ? BUN 16 04/03/2021  ? CREATININE 1.17 04/03/2021  ? BILITOT 0.6 04/03/2021  ? ALKPHOS 41 04/03/2021  ? AST 15 04/03/2021  ? ALT 8 04/03/2021  ? PROT 6.9 04/03/2021  ? ALBUMIN 4.2 04/03/2021  ? CALCIUM 9.2 04/03/2021  ? ANIONGAP 7 08/22/2015  ? GFR 53.41 (L) 04/03/2021  ? ?Lab Results  ?Component Value Date  ? CHOL 191 04/03/2021  ? ?Lab Results  ?Component Value Date  ? HDL 64.20 04/03/2021  ? ?Lab Results  ?Component Value Date  ? LDLCALC 105 (H) 04/03/2021  ? ?Lab Results  ?Component Value Date  ? TRIG  109.0 04/03/2021  ? ?Lab Results  ?Component Value Date  ? CHOLHDL 3 04/03/2021  ? ?No results found for: HGBA1C ? ?   ?Assessment & Plan:  ? ?Problem List Items Addressed This Visit   ? ?  ? Unprioritized  ? Preventative heal

## 2021-07-29 NOTE — Assessment & Plan Note (Signed)
ghm utd Check labs  See avs  

## 2021-07-29 NOTE — Telephone Encounter (Signed)
PA initiated via Covermymeds; KEY: [id number]. Awaiting determination.  ?

## 2021-07-29 NOTE — Assessment & Plan Note (Signed)
Well controlled, no changes to meds. Encouraged heart healthy diet such as the DASH diet and exercise as tolerated.  °

## 2021-07-29 NOTE — Telephone Encounter (Signed)
PA approved. Effective 07/29/21 to 02/28/22. ?

## 2021-07-29 NOTE — Patient Instructions (Signed)

## 2021-07-29 NOTE — Assessment & Plan Note (Signed)
lexapro is helping  ? ?

## 2021-07-29 NOTE — Assessment & Plan Note (Signed)
wegovy 0.25 weekly x 1 month ?Then 0.5 mg weekly sent in  ?F/u  3 months  ?

## 2021-07-30 ENCOUNTER — Other Ambulatory Visit: Payer: Self-pay | Admitting: Family Medicine

## 2021-09-12 ENCOUNTER — Encounter: Payer: Self-pay | Admitting: Family Medicine

## 2021-09-22 ENCOUNTER — Other Ambulatory Visit: Payer: Self-pay | Admitting: Family Medicine

## 2021-12-09 ENCOUNTER — Ambulatory Visit: Payer: BC Managed Care – PPO | Admitting: Internal Medicine

## 2021-12-28 ENCOUNTER — Other Ambulatory Visit: Payer: Self-pay | Admitting: Family Medicine

## 2022-01-06 ENCOUNTER — Ambulatory Visit: Payer: BC Managed Care – PPO | Admitting: Internal Medicine

## 2022-01-06 ENCOUNTER — Encounter: Payer: Self-pay | Admitting: Family Medicine

## 2022-01-06 ENCOUNTER — Ambulatory Visit: Payer: BC Managed Care – PPO | Admitting: Family Medicine

## 2022-01-06 ENCOUNTER — Other Ambulatory Visit: Payer: Self-pay | Admitting: Family Medicine

## 2022-01-06 VITALS — BP 116/78 | HR 75 | Temp 97.8°F | Wt 201.0 lb

## 2022-01-06 DIAGNOSIS — E785 Hyperlipidemia, unspecified: Secondary | ICD-10-CM

## 2022-01-06 DIAGNOSIS — R110 Nausea: Secondary | ICD-10-CM

## 2022-01-06 DIAGNOSIS — R42 Dizziness and giddiness: Secondary | ICD-10-CM | POA: Diagnosis not present

## 2022-01-06 DIAGNOSIS — I10 Essential (primary) hypertension: Secondary | ICD-10-CM

## 2022-01-06 DIAGNOSIS — U071 COVID-19: Secondary | ICD-10-CM

## 2022-01-06 LAB — POC COVID19 BINAXNOW: SARS Coronavirus 2 Ag: POSITIVE — AB

## 2022-01-06 MED ORDER — FLUTICASONE PROPIONATE 50 MCG/ACT NA SUSP: 2.0000 | Freq: Every day | NASAL | 6 refills | Status: AC

## 2022-01-06 MED ORDER — ONDANSETRON HCL 4 MG PO TABS: 4.0000 mg | ORAL_TABLET | Freq: Three times a day (TID) | ORAL | 0 refills | Status: DC | PRN

## 2022-01-06 MED ORDER — MECLIZINE HCL 25 MG PO TABS: 25.0000 mg | ORAL_TABLET | Freq: Three times a day (TID) | ORAL | 0 refills | Status: DC | PRN

## 2022-01-06 NOTE — Patient Instructions (Signed)
For nausea, use ondansetron.  For vertigo, use meclizine.  For congestion, use flonase.  For COVID, Quarantine at home for 5 days since the start of illness. After this you, may exit quarantine, but please still wear a mask indoors and outdoors for 5 more days.   Please be sure to drink plenty of fluids.   You may take the following OTC medications to help with symptoms:  For cough, use  cough syrups or other cough suppressants.  For headache, sore throat, fevers, muscle aches, chills, other pain, take ibuprofen or tylenol  For congestion, use nasal sprays, decongestants, or antihistamines  Please follow up if no improvement.   Go to ED if you have severe chest pain, fevers, shortness of breath or other worrisome symptoms.

## 2022-01-06 NOTE — Progress Notes (Signed)
Assessment/Plan:   Problem List Items Addressed This Visit       Other   COVID    COVID-19  Mild symptoms, vaccinated, overall I believe low risk and does not warrant antiviral .  History of vertigo, likely exacerbated by COVID 18 Recommend supportive care with meclizine, Zofran and Flonase and other OTC remedies as needed Quarantine and return precautions discussed        Relevant Medications   meclizine (ANTIVERT) 25 MG tablet   ondansetron (ZOFRAN) 4 MG tablet   fluticasone (FLONASE) 50 MCG/ACT nasal spray   Other Visit Diagnoses     Nausea    -  Primary   Relevant Orders   POC COVID-19 (Completed)   Vertigo       Relevant Medications   meclizine (ANTIVERT) 25 MG tablet   ondansetron (ZOFRAN) 4 MG tablet   fluticasone (FLONASE) 50 MCG/ACT nasal spray          Subjective:  HPI:  [name] is a 56 y.o. female who has Menorrhagia; Fibroadenoma of breast, left; Overactive bladder; Essential hypertension, benign; Class 1 obesity due to excess calories with serious comorbidity and body mass index (BMI) of 32.0 to 32.9 in adult; Depression with anxiety; Hot flashes due to menopause; Anxiety; Preventative health care; and COVID on their problem list..   She  has a past medical history of Fibroadenoma of breast, left (02/20/2011), Hypertension, and Menorrhagia..   She presents with chief complaint of Ear pressure (Dizziness, nausea, and ringing in ears. Was exposed to covid positive person 1 week ago. ) .  COVID19. She complains of achiness, congestion, cough described as nonproductive, and vertigo.with chills, no fever. Onset of symptoms was  5 days ago and  cough, congestion improved, but has developed nasuea and right sided vertigo. Denies headache .She is drinking plenty of fluids.  Patient tested positive for COVID today. Evaluation to date: none. Treatment to date: nasal steroids. Past history is significant for asthma. Patient is non-smoker. COVID Vaccination  status: Original series: Yes, Booster: Yes patient does have a history of vertigo.  She has treated this with meclizine in the past.  Patient with some nausea, but no vomiting.  Patient had exposure to someone with COVID about 1 week ago.  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  ROS: The patient denies chest pain, dyspnea, wheezing or hemoptysis.   Past Surgical History:  Procedure Laterality Date   LIPOMA EXCISION Left 04/03/2020   L shoulder    ROTATOR CUFF REPAIR Right 04/03/2020   Dr Tamera Punt     Outpatient Medications Prior to Visit  Medication Sig Dispense Refill   albuterol (VENTOLIN HFA) 108 (90 Base) MCG/ACT inhaler INHALE 1 TO 2 PUFFS INTO THE LUNGS EVERY 6 HOURS AS NEEDED FOR WHEEZING OR SHORTNESS OF BREATH. 18 each 3   escitalopram (LEXAPRO) 20 MG tablet TAKE 1 TABLET BY MOUTH EVERY DAY 90 tablet 2   mometasone (NASONEX) 50 MCG/ACT nasal spray Place 2 sprays into the nose daily.     scopolamine (TRANSDERM-SCOP) 1 MG/3DAYS Place 1 patch (1.5 mg total) onto the skin every 3 (three) days. 10 patch 12   telmisartan (MICARDIS) 40 MG tablet Take 1 tablet (40 mg total) by mouth daily. 90 tablet 1   amLODipine (NORVASC) 5 MG tablet TAKE 1 TABLET BY MOUTH EVERY DAY 90 tablet 2   ASPIRIN  81 PO Take by mouth. Pt reports taking PRN (Patient not taking: Reported on 01/06/2022)     Semaglutide-Weight Management (WEGOVY) 0.5 MG/0.5ML SOAJ Inject 0.5 mg into the skin once a week. (Patient not taking: Reported on 01/06/2022) 2 mL 1   terbinafine (LAMISIL) 250 MG tablet Take 1 tablet by mouth daily. (Patient not taking: Reported on 01/06/2022)     TOVIAZ 4 MG TB24 tablet  (Patient not taking: Reported on 01/06/2022)     No facility-administered medications prior to visit.    Family History  Problem Relation Age of Onset   Hypertension Mother    Arthritis Mother    Heart attack  Father    Hypertension Father    Diabetes Father    Heart attack Paternal Grandfather 30    Social History   Socioeconomic History   Marital status: Married    Spouse name: Not on file   Number of children: Not on file   Years of education: Not on file   Highest education level: Not on file  Occupational History   Not on file  Tobacco Use   Smoking status: Never   Smokeless tobacco: Never  Vaping Use   Vaping Use: Never used  Substance and Sexual Activity   Alcohol use: No   Drug use: No   Sexual activity: Not on file  Other Topics Concern   Not on file  Social History Narrative   Exercising --- working with trainer    Social Determinants of Health   Financial Resource Strain: Not on file  Food Insecurity: Not on file  Transportation Needs: Not on file  Physical Activity: Not on file  Stress: Not on file  Social Connections: Not on file  Intimate Partner Violence: Not on file                                                                                                 Objective:  Physical Exam: BP 116/78 (BP Location: Left Arm, Patient Position: Sitting, Cuff Size: Large)   Pulse 75   Temp 97.8 F (36.6 C) (Temporal)   Wt 201 lb (91.2 kg)   SpO2 98%   BMI 33.97 kg/m    General: No acute distress. Awake and conversant.  Eyes: Normal conjunctiva, anicteric. Round symmetric pupils.  EOMI, [place] [facility]: Hearing grossly intact. No nasal discharge.  Bilateral pearly tympanic membranes, otic canals are clear Neck: Neck is supple. No masses or thyromegaly.  Respiratory: Respirations are non-labored. No auditory wheezing.  CTA B Skin: Warm. No rashes or ulcers.  Psych: Alert and oriented. Cooperative, Appropriate mood and affect, Normal judgment.  CV: No cyanosis or JVD, RRR, no MRG ABD: Nontender, nondistended MSK: Normal ambulation. No clubbing  Neuro: Sensation and CN II-XII grossly normal.        Alesia Banda, MD, MS

## 2022-01-06 NOTE — Progress Notes (Signed)
5

## 2022-01-06 NOTE — Assessment & Plan Note (Signed)
COVID-19  Mild symptoms, vaccinated, overall I believe low risk and does not warrant antiviral .  History of vertigo, likely exacerbated by COVID 18 Recommend supportive care with meclizine, Zofran and Flonase and other OTC remedies as needed Quarantine and return precautions discussed

## 2022-01-28 ENCOUNTER — Ambulatory Visit: Payer: BC Managed Care – PPO | Admitting: Family Medicine

## 2022-01-28 NOTE — Progress Notes (Signed)
NEW PATIENT Date of Service/Encounter:  01/30/22 Referring provider: Carollee Herter, Alferd Apa, * Primary care provider: Carollee Herter, Alferd Apa, DO  Subjective:  [name] is a 56 y.o. female with a PMHx of hypertension, obesity, depression, anxiety presenting today for evaluation of "allergies" History obtained from: chart review and patient.   Her main concern is that something is causing her to itch at night.  She feels this is secondary to a food.  Sometimes has a rash, currently has a rash on her underarms, but this does not itch. Current rash has been present for a week.  Itching has been ongoing on an off for about one year, usually no rash apparent. She has not used anything to help resolve itching or rash. She is not interested in topical ointment or antihistamines. States most antihistamines cause adverse effects.  She has not changed her diet or any personal care products.  No previous history of eczema.   She is concerned that itching is secondary to a food allergy.  Years ago she ate a cinnamon roll and her thighs welted up and she turned red. She had cinnamon blood testing which was negative. She is now eating cinnamon again and ate a cinnamon roll about one month ago without any reaction.  She eats wheat without symptoms, but doesn't eat often.  She did start a medication for overactive bladder about a year ago. This was around the time itching started.  She has also had concerns about walnuts after eating an icecream with walnuts and she had significant itching that night. She had to take a benadryl to help relieve itching. This was around 2 months ago.   Chronic rhinitis:  Occurs mostly in Fall, Winter and Spring. She has a cough every day. Worsens in Spring. She does have a lot of drainage.  It can lead to hoarseness. She does have reflux, but is not being treated for this. She does not like take medications. She does use Flonase daily and has no interest in using other  nasal sprays to help with additional symptoms. She will use nasal sprays for sinus pressure or if she is getting a headache  She does have vertigo worsened by allergies. Did do allergy injections when she was younger. She did not like it because she would have large local reactions during therapy. This was in the 80s and 90s. She takes meclizine for her vertigo.  Has asthma.  Diagnosed in [employer] or college. She has never needed controller inhaler.  Diagnosed due to exercise intolerance in sports Never hospitalized due to asthma.  Never required OCS or steroid injections due to asthma.  Using albuterol as needed - will use for coughing.  Cough sometimes relieved with albuterol. Using rescue inhaler about 1-2 times per month.   07/29/21: Malone 100,  2017 CXR normal  She has had multiple allergy testing in the past. She has been told oysters were positive in the past. She has felt allergy symptoms from eating cinnamon and walnuts, but has had negative testing for these in the past.  Her symptoms have been itching on the back. It has been a while since her last allergy test. PCP visit 07/29/2021-diagnosis: multiple allergies, and plan: referral to allergy .   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 18 each 3   amLODipine (NORVASC) 5 MG tablet TAKE 1 TABLET BY MOUTH EVERY DAY 90 tablet 2   escitalopram (LEXAPRO) 20 MG tablet TAKE 1 TABLET BY MOUTH EVERY DAY 90 tablet 2   fluticasone (FLONASE) 50 MCG/ACT nasal spray Place 2 sprays into both nostrils daily. 16 g 6   meclizine (ANTIVERT) 25 MG  tablet Take 1 tablet (25 mg total) by mouth 3 (three) times daily as needed for dizziness. 30 tablet 0   ondansetron (ZOFRAN) 4 MG tablet Take 1 tablet (4 mg total) by mouth every 8 (eight) hours as needed for nausea or vomiting. 20 tablet 0   scopolamine (TRANSDERM-SCOP) 1 MG/3DAYS Place 1 patch (1.5 mg total) onto the skin every 3 (three) days. 10 patch 12   solifenacin (VESICARE) 10 MG tablet Take 10 mg by mouth daily.     telmisartan (MICARDIS) 40 MG tablet Take 1 tablet (40 mg total) by mouth daily. 90 tablet 1   ASPIRIN 81 PO Take by mouth. Pt reports taking PRN (Patient not taking: Reported on 01/06/2022)     mometasone (NASONEX) 50 MCG/ACT nasal spray Place 2 sprays into the nose daily. (Patient not taking: Reported on 01/30/2022)     Semaglutide-Weight Management (WEGOVY) 0.5 MG/0.5ML SOAJ Inject 0.5 mg into the skin once a week. (Patient not taking: Reported on 01/06/2022) 2 mL 1   terbinafine (LAMISIL) 250 MG tablet Take 1 tablet by mouth daily. (Patient not taking: Reported on 01/06/2022)     TOVIAZ 4 MG TB24 tablet  (Patient not taking: Reported on 01/06/2022)     No current facility-administered medications for this visit.   Known Allergies:  Allergies  Allergen Reactions   Codeine Hives, Itching and Nausea And Vomiting   Tramadol Nausea And Vomiting   Amethyst [Levonorgestrel-Ethinyl Estrad] Palpitations    Other symptoms--slurred speech,headache,dizziness PG CMA   Past Surgical History: Past Surgical History:  Procedure Laterality Date   LIPOMA EXCISION Left 04/03/2020   L shoulder    ROTATOR CUFF REPAIR Right 04/03/2020   Dr Tamera Punt    Family History: Family History  Problem Relation Age of Onset   Allergic rhinitis Mother    Hypertension Mother    Arthritis Mother    Heart attack Father    Hypertension Father    Diabetes Father    Asthma Sister    Allergic rhinitis Sister    Allergic rhinitis Brother    Heart attack Paternal Grandfather 75   Social History:  [place] lives .  In a house built 20 years ago, no water damage, carpet floors, gas heating, central AC, dog indoors, no cockroaches, not using dust mite protection on the bedding or pillows, no smoke exposure.  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  Objective:  Blood pressure 114/68, pulse (!) 59, temperature 97.7 F (36.5 C), temperature source Temporal, resp. rate 17, height 5' 3.15" (1.604 m), weight 202 lb 8 oz (91.9 kg), SpO2 96 %. Body mass index is 35.7 kg/m. Physical Exam:  General Appearance:  Alert, cooperative, no distress, appears stated age  Head:  Normocephalic, without obvious abnormality, atraumatic  Eyes:  Conjunctiva clear,  EOM's intact  Nose: Nares normal, hypertrophic turbinates, normal mucosa, no visible anterior polyps, and septum midline  Throat: Lips, tongue normal; teeth and gums normal, normal posterior oropharynx  Neck: Supple, symmetrical  Lungs:   clear to auscultation bilaterally, Respirations unlabored, no coughing  Heart:  regular rate and rhythm and no murmur, Appears well perfused  Extremities: No edema  Skin: Skin color, texture, turgor normal, pinpoint papules on bilateral upper arms  Neurologic: No gross deficits     Diagnostics: Spirometry:  Tracings reviewed. Her effort: Good reproducible efforts. FVC: 2.54L  FEV1: 2.06L, 92% predicted  FEV1/FVC ratio: 0.81 Interpretation: Spirometry consistent with normal pattern   Skin Testing: Environmental allergy panel and select foods.  Adequate controls. Results discussed with patient/family.  Airborne Adult Perc - 01/30/22 1510     Time Antigen Placed 1510    Allergen Manufacturer Lavella Hammock    Location Back    Number of Test 59    Panel 1 Select    1. Control-Buffer 50% Glycerol Negative    2. Control-Histamine 1 mg/ml 3+    3. Albumin saline Negative    4. [place] Negative     5. [place] Negative    6. [place] Negative    7. [place] Blue Negative    8. Meadow Fescue 2+    9. Perennial Rye Negative    10. Sweet Vernal Negative    11. Timothy Negative    12. Cocklebur Negative    13. Burweed Marshelder Negative    14. Ragweed, short Negative    15. Ragweed, Giant Negative    16. Plantain,  English Negative    17. Lamb's Quarters Negative    18. Sheep Sorrell Negative    19. Rough Pigweed Negative    20. Marsh Elder, Rough Negative    21. Mugwort, Common Negative    22. Ash mix Negative    23. Birch mix Negative    24. Beech American Negative    25. Box, Elder Negative    26. Cedar, red Negative    27. Cottonwood, [place] Negative    28. Elm mix Negative    29. Hickory Negative    30. Maple mix Negative    31. Oak, [place] mix Negative    32. Pecan Pollen Negative    33. Pine mix Negative    34. Sycamore Eastern Negative    35. Ple[place], Black Pollen Negative    36. Alternaria alternata Negative    37. Cladosporium Herbarum Negative    38. Aspergillus mix Negative    39. Penicillium mix Negative    40. Bipolaris sorokiniana (Helminthosporium) Negative    41. Drechslera spicifera (Curvularia) Negative    42. Mucor plumbeus 2+    43. Fusarium moniliforme Negative    44. Aureobasidium pullulans (pullulara) Negative    45. Rhizopus oryzae Negative    46. Botrytis cinera Negative    47. Epicoccum nigrum Negative    48. Phoma betae Negative    49. Candida Albicans Negative    50. Trichophyton mentagrophytes Negative    51. Mite, D Farinae  5,000 AU/ml Negative    52. Mite, D Pteronyssinus  5,000 AU/ml Negative    53. Cat Hair 10,000 BAU/ml Negative    54.  Dog Epithelia 2+    55. Mixed Feathers Negative    56. Horse Epithelia Negative    57. Cockroach, German Negative    58. Mouse Negative    59. Tobacco Leaf Negative  Food Adult Perc - 01/30/22 1500     Time Antigen Placed 1511    Allergen Manufacturer Lavella Hammock    Location  Back    Number of allergen test 32    1. Peanut Negative    2. Soybean Negative    3. Wheat Negative    4. Sesame Negative    5. Milk, cow Negative    6. Egg White, Chicken Negative    7. Casein Negative    8. Shellfish Mix Negative    9. Fish Mix Negative    10. Cashew Negative    11. Pecan Food Negative    12. Iron Negative    13. Almond Negative    14. Hazelnut Negative    15. [place] nut Negative    16. Coconut Negative    17. Pistachio Negative    25. Shrimp Negative    26. Crab Negative    27. Lobster Negative    28. Oyster Negative    29. Scallops Negative    34. Rice Negative    42. Tomato Negative    43. White Potato Negative    45. Pea, Green/English Negative    53. Corn Negative    56. Orange  Negative    57. Banana Negative    58. Apple Negative    59. Peach Negative    67. Cinnamon Negative             Allergy testing results were read and interpreted by myself, documented by clinical staff.  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  Started around the time she started a medication for overactive bladder.  Discussed that this would not be a typical symptom of food allergy, but food allergy testing was performed for reassurance.  All food allergens tested were negative which included the top 9 most common food allergies which cover 95% of food allergies.  Additionally cinnamon, multiple fruits and vegetables were also tested for and negative. We also tested for environmental allergens and this was borderline to fescue grass, dog and a minor mold.  They do have a dog in the home, but she does not feel dog provokes her symptoms.  Discussed keeping dog out of the bed and maintaining clean bedding and sheets weekly to see if this helps improve symptoms from possible dog dander. If no improvement with the above measures, we also discussed consideration of patch testing though she does not have a typical contact  dermatitis rash and often has no rash.  Therefore likely low yield. We discussed symptomatic care with antihistamines or trial of topical steroid, but she defers at this time.  She has had normal CBCd and CMP in the past year (April 2023).   Chronic pruritus:  - consider an antihistamine for itch relief-can try Xyzal since you have never tried this - consider switching medications for your overactive bladder to see if this relieves itching - if having rash on back, can consider patch testing in the future  - environmental allergy panel was borderline to fescue grass, dog dander and Mucor a minor mold.  All food allergy testing negative, and symptoms not consistent with food allergy.  Chronic Rhinitis : - Continue Nasal Steroid Spray: Options include Flonase (fluticasone), Nasocort (triamcinolone), Nasonex (mometasome) 1- 2 sprays in each nostril daily (can buy over-the-counter if not covered by insurance)  Best results if used daily. - Consider over the counter antihistamine daily or daily as needed.   -  Xyzal (Levocetirinze) '5mg'$   [facility] Asthma: - your lung testing today looked great - Rescue Inhaler: Albuterol (Proair/Ventolin) 2 puffs . Use  every 4-6 hours as needed for chest tightness, wheezing, or coughing.  Can also use 15 minutes prior to exercise if you have symptoms with activity. - Asthma is not controlled if:  - Symptoms are occurring >2 times a week OR  - >2 times a month nighttime awakenings  - You are requiring systemic steroids (prednisone/steroid injections) more than once per year  - Your require hospitalization for your asthma.  - Please call the clinic to schedule a follow up if these symptoms arise  Follow-up in 6 months, sooner if needed.   This note in its entirety was forwarded to the Provider who requested this consultation.  Thank you for your kind referral. I appreciate the opportunity to take part in [name]'s care. Please do not hesitate to contact me  with questions.  Sincerely,  Sigurd Sos, MD [facility]

## 2022-01-30 ENCOUNTER — Encounter: Payer: Self-pay | Admitting: Internal Medicine

## 2022-01-30 ENCOUNTER — Ambulatory Visit: Payer: BC Managed Care – PPO | Admitting: Internal Medicine

## 2022-01-30 VITALS — BP 114/68 | HR 59 | Temp 97.7°F | Resp 17 | Ht 63.15 in | Wt 202.5 lb

## 2022-01-30 DIAGNOSIS — J302 Other seasonal allergic rhinitis: Secondary | ICD-10-CM

## 2022-01-30 DIAGNOSIS — J4520 Mild intermittent asthma, uncomplicated: Secondary | ICD-10-CM

## 2022-01-30 DIAGNOSIS — J3089 Other allergic rhinitis: Secondary | ICD-10-CM | POA: Diagnosis not present

## 2022-01-30 DIAGNOSIS — J310 Chronic rhinitis: Secondary | ICD-10-CM

## 2022-01-30 DIAGNOSIS — L299 Pruritus, unspecified: Secondary | ICD-10-CM | POA: Diagnosis not present

## 2022-01-30 NOTE — Patient Instructions (Addendum)
Chronic pruritus:  -consider an antihistamine for itch relief-can try Xyzal since you have never tried this - consider switching medications for your overactive bladder to see if this relieves itching - if having rash on back, can consider patch testing in the future  - environmental allergy panel was borderline to fescue grass, dog dander and Mucor a minor mold.  All food allergy testing negative, and symptoms not consistent with food allergy  Chronic Rhinitis : - Continue Nasal Steroid Spray: Options include Flonase (fluticasone), Nasocort (triamcinolone), Nasonex (mometasome) 1- 2 sprays in each nostril daily (can buy over-the-counter if not covered by insurance)  Best results if used daily. - Consider over the counter antihistamine daily or daily as needed.   -Xyzal (Levocetirinze) '5mg'$   Intermittent Asthma: - your lung testing today looked great - Rescue Inhaler: Albuterol (Proair/Ventolin) 2 puffs . Use  every 4-6 hours as needed for chest tightness, wheezing, or coughing.  Can also use 15 minutes prior to exercise if you have symptoms with activity. - Asthma is not controlled if:  - Symptoms are occurring >2 times a week OR  - >2 times a month nighttime awakenings  - You are requiring systemic steroids (prednisone/steroid injections) more than once per year  - Your require hospitalization for your asthma.  - Please call the clinic to schedule a follow up if these symptoms arise  Follow-up in 6 months, sooner if needed.   It was wonderful meeting you today! Thank you for letting me participate in your care. Sigurd Sos, MD [facility]

## 2022-02-04 ENCOUNTER — Encounter: Payer: Self-pay | Admitting: Family Medicine

## 2022-02-04 ENCOUNTER — Other Ambulatory Visit (HOSPITAL_BASED_OUTPATIENT_CLINIC_OR_DEPARTMENT_OTHER): Payer: Self-pay

## 2022-02-04 ENCOUNTER — Ambulatory Visit: Payer: BC Managed Care – PPO | Admitting: Family Medicine

## 2022-02-04 ENCOUNTER — Encounter (HOSPITAL_BASED_OUTPATIENT_CLINIC_OR_DEPARTMENT_OTHER): Payer: Self-pay

## 2022-02-04 VITALS — BP 122/78 | HR 77 | Temp 98.1°F | Resp 16 | Ht 63.15 in | Wt 203.0 lb

## 2022-02-04 DIAGNOSIS — K219 Gastro-esophageal reflux disease without esophagitis: Secondary | ICD-10-CM | POA: Diagnosis not present

## 2022-02-04 DIAGNOSIS — Z23 Encounter for immunization: Secondary | ICD-10-CM

## 2022-02-04 DIAGNOSIS — E6609 Other obesity due to excess calories: Secondary | ICD-10-CM

## 2022-02-04 DIAGNOSIS — I10 Essential (primary) hypertension: Secondary | ICD-10-CM | POA: Diagnosis not present

## 2022-02-04 DIAGNOSIS — E069 Thyroiditis, unspecified: Secondary | ICD-10-CM | POA: Diagnosis not present

## 2022-02-04 DIAGNOSIS — Z6832 Body mass index (BMI) 32.0-32.9, adult: Secondary | ICD-10-CM

## 2022-02-04 LAB — CBC WITH DIFFERENTIAL/PLATELET
Basophils Absolute: 0 10*3/uL (ref 0.0–0.1)
Basophils Relative: 0.6 % (ref 0.0–3.0)
Eosinophils Absolute: 0.2 10*3/uL (ref 0.0–0.7)
Eosinophils Relative: 2.9 % (ref 0.0–5.0)
HCT: 39.6 % (ref 36.0–46.0)
Hemoglobin: 13.2 g/dL (ref 12.0–15.0)
Lymphocytes Relative: 42.8 % (ref 12.0–46.0)
Lymphs Abs: 2.2 10*3/uL (ref 0.7–4.0)
MCHC: 33.2 g/dL (ref 30.0–36.0)
MCV: 86.4 fl (ref 78.0–100.0)
Monocytes Absolute: 0.4 10*3/uL (ref 0.1–1.0)
Monocytes Relative: 7.5 % (ref 3.0–12.0)
Neutro Abs: 2.4 10*3/uL (ref 1.4–7.7)
Neutrophils Relative %: 46.2 % (ref 43.0–77.0)
Platelets: 271 10*3/uL (ref 150.0–400.0)
RBC: 4.59 Mil/uL (ref 3.87–5.11)
RDW: 13.5 % (ref 11.5–15.5)
WBC: 5.2 10*3/uL (ref 4.0–10.5)

## 2022-02-04 LAB — COMPREHENSIVE METABOLIC PANEL
ALT: 8 U/L (ref 0–35)
AST: 15 U/L (ref 0–37)
Albumin: 4.2 g/dL (ref 3.5–5.2)
Alkaline Phosphatase: 40 U/L (ref 39–117)
BUN: 17 mg/dL (ref 6–23)
CO2: 32 mEq/L (ref 19–32)
Calcium: 9.2 mg/dL (ref 8.4–10.5)
Chloride: 104 mEq/L (ref 96–112)
Creatinine, Ser: 1.35 mg/dL — ABNORMAL HIGH (ref 0.40–1.20)
GFR: 44.72 mL/min — ABNORMAL LOW (ref >60.00)
Glucose, Bld: 84 mg/dL (ref 70–99)
Potassium: 4.4 mEq/L (ref 3.5–5.1)
Sodium: 142 mEq/L (ref 135–145)
Total Bilirubin: 0.6 mg/dL (ref 0.2–1.2)
Total Protein: 6.7 g/dL (ref 6.0–8.3)

## 2022-02-04 LAB — HEMOGLOBIN A1C: Hgb A1c MFr Bld: 6.2 % (ref 4.6–6.5)

## 2022-02-04 LAB — LIPID PANEL
Cholesterol: 185 mg/dL (ref 0–200)
HDL: 53.9 mg/dL (ref >39.00)
LDL Cholesterol: 110 mg/dL — ABNORMAL HIGH (ref 0–99)
NonHDL: 131.43
Total CHOL/HDL Ratio: 3
Triglycerides: 109 mg/dL (ref 0.0–149.0)
VLDL: 21.8 mg/dL (ref 0.0–40.0)

## 2022-02-04 LAB — TSH: TSH: 1.58 u[IU]/mL (ref 0.35–5.50)

## 2022-02-04 MED ORDER — WEGOVY 0.5 MG/0.5ML ~~LOC~~ SOAJ: 0.5000 mg | SUBCUTANEOUS | 1 refills | Status: DC

## 2022-02-04 MED ORDER — WEGOVY 0.5 MG/0.5ML ~~LOC~~ SOAJ
0.5000 mg | SUBCUTANEOUS | 1 refills | Status: DC
  Filled 2022-02-04: qty 2, 28d supply, fill #0

## 2022-02-04 NOTE — Progress Notes (Signed)
Subjective:   By signing my name below, I, Shehryar Baig, attest that this documentation has been prepared under the direction and in the presence of Ann Held, DO. 02/04/2022    Patient ID: [name], female    DOB: 1966-01-21, 56 y.o.   MRN: [id number]  Chief Complaint  Patient presents with   Hypertension   Follow-up    Hypertension Pertinent negatives include no blurred vision, chest pain, headaches, malaise/fatigue, palpitations or shortness of breath.   Patient is in today for a follow up visit.   She complains of general itching. She has taken an allergy test recently and found she does not have any allergy that causes her to itch. She has brought all her medications to find if any of them are causing her to itch. Her only medication with a common side effect of itching is 10 mg vesicare.  She is requesting a refill on wegovy. She has run out of it for the past couple of weeks. She reports losing weight while taking it. She currently weighs 203 lb's and has a BMI of 35.79 kg/m^2 Wt Readings from Last 3 Encounters:  02/04/22 203 lb (92.1 kg)  01/30/22 202 lb 8 oz (91.9 kg)  01/06/22 201 lb (91.2 kg)   She continues having hot flashes.  She has not started taking finasteride.    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   bilateral mammography and left breast U/S in 6 months   Hypertension    Menorrhagia     Past Surgical History:  Procedure Laterality Date   LIPOMA EXCISION Left 04/03/2020   L shoulder    ROTATOR CUFF REPAIR Right 04/03/2020   Dr Tamera Punt     Family History  Problem Relation Age of Onset   Allergic rhinitis Mother    Hypertension Mother    Arthritis Mother    Heart attack Father    Hypertension Father    Diabetes Father    Asthma Sister    Allergic rhinitis Sister    Allergic rhinitis Brother    Heart attack Paternal Grandfather 55    Social History   Socioeconomic  History   Marital status: Married    Spouse name: Not on file   Number of children: Not on file   Years of education: Not on file   Highest education level: Not on file  Occupational History   Not on file  Tobacco Use   Smoking status: Never    Passive exposure: Never   Smokeless tobacco: Never  Vaping Use   Vaping Use: Never used  Substance and Sexual Activity   Alcohol use: No   Drug use: No   Sexual activity: Not on file  Other Topics Concern   Not on file  Social History Narrative   Exercising --- working with trainer    Social Determinants of Health   Financial Resource Strain: Not on file  Food Insecurity: Not on file  Transportation Needs: Not on file  Physical Activity: Not on file  Stress: Not on file  Social Connections: Not on file  Intimate Partner Violence: Not on file    Outpatient Medications Prior to Visit  Medication Sig Dispense Refill   albuterol (VENTOLIN HFA) 108 (90 Base) MCG/ACT inhaler INHALE 1 TO 2 PUFFS INTO THE LUNGS EVERY 6 HOURS AS NEEDED FOR WHEEZING OR SHORTNESS OF BREATH. 18 each 3   amLODipine (NORVASC) 5 MG tablet  TAKE 1 TABLET BY MOUTH EVERY DAY 90 tablet 2   escitalopram (LEXAPRO) 20 MG tablet TAKE 1 TABLET BY MOUTH EVERY DAY 90 tablet 2   fluticasone (FLONASE) 50 MCG/ACT nasal spray Place 2 sprays into both nostrils daily. 16 g 6   meclizine (ANTIVERT) 25 MG tablet Take 1 tablet (25 mg total) by mouth 3 (three) times daily as needed for dizziness. 30 tablet 0   ondansetron (ZOFRAN) 4 MG tablet Take 1 tablet (4 mg total) by mouth every 8 (eight) hours as needed for nausea or vomiting. 20 tablet 0   scopolamine (TRANSDERM-SCOP) 1 MG/3DAYS Place 1 patch (1.5 mg total) onto the skin every 3 (three) days. 10 patch 12   solifenacin (VESICARE) 10 MG tablet Take 10 mg by mouth daily.     telmisartan (MICARDIS) 40 MG tablet Take 1 tablet (40 mg total) by mouth daily. 90 tablet 1   ASPIRIN 81 PO Take by mouth. Pt reports taking PRN (Patient not  taking: Reported on 01/06/2022)     mometasone (NASONEX) 50 MCG/ACT nasal spray Place 2 sprays into the nose daily. (Patient not taking: Reported on 01/30/2022)     Semaglutide-Weight Management (WEGOVY) 0.5 MG/0.5ML SOAJ Inject 0.5 mg into the skin once a week. (Patient not taking: Reported on 01/06/2022) 2 mL 1   terbinafine (LAMISIL) 250 MG tablet Take 1 tablet by mouth daily. (Patient not taking: Reported on 01/06/2022)     TOVIAZ 4 MG TB24 tablet  (Patient not taking: Reported on 01/06/2022)     No facility-administered medications prior to visit.    Allergies  Allergen Reactions   Codeine Hives, Itching and Nausea And Vomiting   Tramadol Nausea And Vomiting   Amethyst [Levonorgestrel-Ethinyl Estrad] Palpitations    Other symptoms--slurred speech,headache,dizziness PG CMA    Review of Systems  Constitutional:  Negative for fever and malaise/fatigue.  HENT:  Negative for congestion.   Eyes:  Negative for blurred vision.  Respiratory:  Negative for shortness of breath.   Cardiovascular:  Negative for chest pain, palpitations and leg swelling.  Gastrointestinal:  Negative for abdominal pain, blood in stool and nausea.  Genitourinary:  Negative for dysuria and frequency.  Musculoskeletal:  Negative for falls.  Skin:  Positive for itching (general). Negative for rash.  Neurological:  Negative for dizziness, loss of consciousness and headaches.  Endo/Heme/Allergies:  Negative for environmental allergies.  Psychiatric/Behavioral:  Negative for depression. The patient is not nervous/anxious.        Objective:    Physical Exam Vitals and nursing note reviewed.  Constitutional:      General: She is not in acute distress.    Appearance: Normal appearance. She is not ill-appearing.  HENT:     Head: Normocephalic and atraumatic.     Right Ear: External ear normal.     Left Ear: External ear normal.  Eyes:     Extraocular Movements: Extraocular movements intact.     Pupils: Pupils  are equal, round, and reactive to light.  Cardiovascular:     Rate and Rhythm: Normal rate and regular rhythm.     Heart sounds: Normal heart sounds. No murmur heard.    No gallop.  Pulmonary:     Effort: Pulmonary effort is normal. No respiratory distress.     Breath sounds: Normal breath sounds. No wheezing or rales.  Skin:    General: Skin is warm and dry.  Neurological:     Mental Status: She is alert and oriented to person, place, and  time.  Psychiatric:        Judgment: Judgment normal.     BP 122/78 (BP Location: Right Arm, Patient Position: Sitting, Cuff Size: Large)   Pulse 77   Temp 98.1 F (36.7 C) (Oral)   Resp 16   Ht 5' 3.15" (1.604 m)   Wt 203 lb (92.1 kg)   SpO2 95%   BMI 35.79 kg/m  Wt Readings from Last 3 Encounters:  02/04/22 203 lb (92.1 kg)  01/30/22 202 lb 8 oz (91.9 kg)  01/06/22 201 lb (91.2 kg)    Diabetic Foot Exam - Simple   No data filed    Lab Results  Component Value Date   WBC 4.4 07/29/2021   HGB 12.6 07/29/2021   HCT 38.1 07/29/2021   PLT 262.0 07/29/2021   GLUCOSE 85 07/29/2021   CHOL 194 07/29/2021   TRIG 54.0 07/29/2021   HDL 63.40 07/29/2021   LDLCALC 120 (H) 07/29/2021   ALT 8 07/29/2021   AST 16 07/29/2021   NA 138 07/29/2021   K 4.1 07/29/2021   CL 102 07/29/2021   CREATININE 1.13 07/29/2021   BUN 12 07/29/2021   CO2 30 07/29/2021   TSH 1.66 07/29/2021    Lab Results  Component Value Date   TSH 1.66 07/29/2021   Lab Results  Component Value Date   WBC 4.4 07/29/2021   HGB 12.6 07/29/2021   HCT 38.1 07/29/2021   MCV 88.3 07/29/2021   PLT 262.0 07/29/2021   Lab Results  Component Value Date   NA 138 07/29/2021   K 4.1 07/29/2021   CO2 30 07/29/2021   GLUCOSE 85 07/29/2021   BUN 12 07/29/2021   CREATININE 1.13 07/29/2021   BILITOT 0.5 07/29/2021   ALKPHOS 36 (L) 07/29/2021   AST 16 07/29/2021   ALT 8 07/29/2021   PROT 6.4 07/29/2021   ALBUMIN 4.2 07/29/2021   CALCIUM 8.8 07/29/2021   ANIONGAP 7  08/22/2015   GFR 55.56 (L) 07/29/2021   Lab Results  Component Value Date   CHOL 194 07/29/2021   Lab Results  Component Value Date   HDL 63.40 07/29/2021   Lab Results  Component Value Date   LDLCALC 120 (H) 07/29/2021   Lab Results  Component Value Date   TRIG 54.0 07/29/2021   Lab Results  Component Value Date   CHOLHDL 3 07/29/2021   No results found for: "HGBA1C"     Assessment & Plan:   Problem List Items Addressed This Visit       Unprioritized   Essential hypertension, benign    Well controlled, no changes to meds. Encouraged heart healthy diet such as the DASH diet and exercise as tolerated.       Class 1 obesity due to excess calories with serious comorbidity and body mass index (BMI) of 32.0 to 32.9 in adult    Refill wegovy Recheck 3 months      Relevant Medications   Semaglutide-Weight Management (WEGOVY) 0.5 MG/0.5ML SOAJ   Other Visit Diagnoses     Need for influenza vaccination    -  Primary   Relevant Orders   Flu Vaccine QUAD 59moIM (Fluarix, Fluzone & Alfiuria Quad PF)   Primary hypertension       Relevant Orders   CBC with Differential/Platelet   Comprehensive metabolic panel   Lipid panel   Hemoglobin A1c   TSH   Insulin, random   Gastroesophageal reflux disease, unspecified whether esophagitis present  Relevant Orders   CBC with Differential/Platelet   Comprehensive metabolic panel   Lipid panel   Hemoglobin A1c   TSH   Insulin, random   Thyroiditis       Relevant Orders   CBC with Differential/Platelet   Comprehensive metabolic panel   Lipid panel   Hemoglobin A1c   TSH   Insulin, random   Morbid obesity (HCC)       Relevant Medications   Semaglutide-Weight Management (WEGOVY) 0.5 MG/0.5ML SOAJ        Meds ordered this encounter  Medications   DISCONTD: Semaglutide-Weight Management (WEGOVY) 0.5 MG/0.5ML SOAJ    Sig: Inject 0.5 mg into the skin once a week.    Dispense:  2 mL    Refill:  1   DISCONTD:  Semaglutide-Weight Management (WEGOVY) 0.5 MG/0.5ML SOAJ    Sig: Inject 0.5 mg into the skin once a week.    Dispense:  2 mL    Refill:  1   Semaglutide-Weight Management (WEGOVY) 0.5 MG/0.5ML SOAJ    Sig: Inject 0.5 mg into the skin once a week.    Dispense:  2 mL    Refill:  1    I, Ann Held, DO, personally preformed the services described in this documentation.  All medical record entries made by the scribe were at my direction and in my presence.  I have reviewed the chart and discharge instructions (if applicable) and agree that the record reflects my personal performance and is accurate and complete. 02/04/2022   I,Shehryar Baig,acting as a scribe for Ann Held, DO.,have documented all relevant documentation on the behalf of Ann Held, DO,as directed by  Ann Held, DO while in the presence of Ann Held, DO.   Ann Held, DO

## 2022-02-04 NOTE — Assessment & Plan Note (Signed)
Refill wegovy Recheck 3 months

## 2022-02-04 NOTE — Assessment & Plan Note (Signed)
Well controlled, no changes to meds. Encouraged heart healthy diet such as the DASH diet and exercise as tolerated.  °

## 2022-02-05 LAB — INSULIN, RANDOM: Insulin: 34.1 u[IU]/mL — ABNORMAL HIGH

## 2022-02-05 NOTE — Telephone Encounter (Signed)
FYI

## 2022-02-07 ENCOUNTER — Other Ambulatory Visit (HOSPITAL_BASED_OUTPATIENT_CLINIC_OR_DEPARTMENT_OTHER): Payer: Self-pay

## 2022-02-07 MED ORDER — WEGOVY 1 MG/0.5ML ~~LOC~~ SOAJ
1.0000 mg | SUBCUTANEOUS | 0 refills | Status: DC
  Filled 2022-02-07: qty 2, 28d supply, fill #0

## 2022-02-12 ENCOUNTER — Other Ambulatory Visit: Payer: Self-pay | Admitting: Family Medicine

## 2022-02-12 ENCOUNTER — Other Ambulatory Visit (HOSPITAL_BASED_OUTPATIENT_CLINIC_OR_DEPARTMENT_OTHER): Payer: Self-pay

## 2022-02-12 DIAGNOSIS — J4520 Mild intermittent asthma, uncomplicated: Secondary | ICD-10-CM

## 2022-03-12 ENCOUNTER — Other Ambulatory Visit: Payer: Self-pay | Admitting: Family Medicine

## 2022-03-12 ENCOUNTER — Other Ambulatory Visit (HOSPITAL_BASED_OUTPATIENT_CLINIC_OR_DEPARTMENT_OTHER): Payer: Self-pay

## 2022-03-12 MED ORDER — WEGOVY 1.7 MG/0.75ML ~~LOC~~ SOAJ
1.7000 mg | SUBCUTANEOUS | 0 refills | Status: DC
  Filled 2022-03-12: qty 3, 28d supply, fill #0

## 2022-03-17 ENCOUNTER — Other Ambulatory Visit (HOSPITAL_BASED_OUTPATIENT_CLINIC_OR_DEPARTMENT_OTHER): Payer: Self-pay

## 2022-03-18 ENCOUNTER — Other Ambulatory Visit (HOSPITAL_BASED_OUTPATIENT_CLINIC_OR_DEPARTMENT_OTHER): Payer: Self-pay

## 2022-03-19 ENCOUNTER — Other Ambulatory Visit (HOSPITAL_BASED_OUTPATIENT_CLINIC_OR_DEPARTMENT_OTHER): Payer: Self-pay

## 2022-03-20 ENCOUNTER — Other Ambulatory Visit (HOSPITAL_BASED_OUTPATIENT_CLINIC_OR_DEPARTMENT_OTHER): Payer: Self-pay

## 2022-03-21 ENCOUNTER — Other Ambulatory Visit (HOSPITAL_BASED_OUTPATIENT_CLINIC_OR_DEPARTMENT_OTHER): Payer: Self-pay

## 2022-03-24 ENCOUNTER — Other Ambulatory Visit (HOSPITAL_BASED_OUTPATIENT_CLINIC_OR_DEPARTMENT_OTHER): Payer: Self-pay

## 2022-03-24 ENCOUNTER — Telehealth: Payer: Self-pay | Admitting: Family Medicine

## 2022-03-24 NOTE — Telephone Encounter (Signed)
Pt called stating that the pharmacy needed a PA for the wegovy that [place] sent in back on 11.22.23. After reviewing chart, did not see where there was info on a current PA for that medication and advised pt that a note would be sent back to investigate this. Pt acknowledged understanding.

## 2022-03-25 ENCOUNTER — Other Ambulatory Visit (HOSPITAL_BASED_OUTPATIENT_CLINIC_OR_DEPARTMENT_OTHER): Payer: Self-pay

## 2022-03-25 NOTE — Telephone Encounter (Signed)
PA initiated via Covermymeds; KEY:  [id number]. Awaiting determination.

## 2022-03-26 ENCOUNTER — Other Ambulatory Visit (HOSPITAL_BASED_OUTPATIENT_CLINIC_OR_DEPARTMENT_OTHER): Payer: Self-pay

## 2022-04-09 ENCOUNTER — Other Ambulatory Visit: Payer: Self-pay | Admitting: Family Medicine

## 2022-04-17 ENCOUNTER — Other Ambulatory Visit: Payer: Self-pay

## 2022-04-17 ENCOUNTER — Other Ambulatory Visit (HOSPITAL_BASED_OUTPATIENT_CLINIC_OR_DEPARTMENT_OTHER): Payer: Self-pay

## 2022-04-17 ENCOUNTER — Other Ambulatory Visit: Payer: Self-pay | Admitting: Family Medicine

## 2022-04-17 MED ORDER — WEGOVY 2.4 MG/0.75ML ~~LOC~~ SOAJ
2.4000 mg | SUBCUTANEOUS | 3 refills | Status: DC
  Filled 2022-04-17: qty 3, 28d supply, fill #0
  Filled 2022-05-20: qty 3, 28d supply, fill #1
  Filled 2022-06-17: qty 3, 28d supply, fill #2
  Filled 2022-07-18 (×2): qty 3, 28d supply, fill #3

## 2022-05-13 ENCOUNTER — Ambulatory Visit: Payer: BC Managed Care – PPO | Admitting: Family Medicine

## 2022-05-13 ENCOUNTER — Encounter: Payer: Self-pay | Admitting: Family Medicine

## 2022-05-13 VITALS — BP 120/88 | HR 68 | Temp 98.6°F | Resp 18 | Ht 63.0 in | Wt 191.6 lb

## 2022-05-13 DIAGNOSIS — I10 Essential (primary) hypertension: Secondary | ICD-10-CM | POA: Diagnosis not present

## 2022-05-13 NOTE — Assessment & Plan Note (Signed)
Well controlled, no changes to meds. Encouraged heart healthy diet such as the DASH diet and exercise as tolerated.  °

## 2022-05-13 NOTE — Progress Notes (Signed)
Subjective:   By signing my name below, I, [name], attest that this documentation has been prepared under the direction and in the presence of Nancy Held, DO 05/13/22   Patient ID: [name], female    DOB: 03/14/1966, 57 y.o.   MRN: [id number]  Chief Complaint  Patient presents with   Hypertension   Follow-up    Hypertension Pertinent negatives include no blurred vision, chest pain, headaches, malaise/fatigue, palpitations or shortness of breath.   Patient is in today for a follow-up.  She reports feeling generally well. She is not requesting any refills at this time. She denies swelling in her ankles.  She has not had a Covid-19 vaccine this year. She wanted to wait until she gets the flu vaccine. UTD on flu vaccine.    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   bilateral mammography and left breast U/S in 6 months   Hypertension    Menorrhagia     Past Surgical History:  Procedure Laterality Date   LIPOMA EXCISION Left 04/03/2020   L shoulder    ROTATOR CUFF REPAIR Right 04/03/2020   Dr Tamera Punt     Family History  Problem Relation Age of Onset   Allergic rhinitis Mother    Hypertension Mother    Arthritis Mother    Heart attack Father    Hypertension Father    Diabetes Father    Asthma Sister    Allergic rhinitis Sister    Allergic rhinitis Brother    Heart attack Paternal Grandfather 33    Social History   Socioeconomic History   Marital status: Married    Spouse name: Not on file   Number of children: Not on file   Years of education: Not on file   Highest education level: Not on file  Occupational History   Not on file  Tobacco Use   Smoking status: Never    Passive exposure: Never   Smokeless tobacco: Never  Vaping Use   Vaping Use: Never used  Substance and Sexual Activity   Alcohol use: No   Drug use: No   Sexual activity: Not on file  Other Topics Concern    Not on file  Social History Narrative   Exercising --- working with trainer    Social Determinants of Health   Financial Resource Strain: Not on file  Food Insecurity: Not on file  Transportation Needs: Not on file  Physical Activity: Not on file  Stress: Not on file  Social Connections: Not on file  Intimate Partner Violence: Not on file    Outpatient Medications Prior to Visit  Medication Sig Dispense Refill   albuterol (VENTOLIN HFA) 108 (90 Base) MCG/ACT inhaler Inhale 1-2 puffs into the lungs every 6 (six) hours as needed for wheezing or shortness of breath. 18 each 5   amLODipine (NORVASC) 5 MG tablet TAKE 1 TABLET BY MOUTH EVERY DAY 90 tablet 2   escitalopram (LEXAPRO) 20 MG tablet TAKE 1 TABLET BY MOUTH EVERY DAY 90 tablet 2   fluticasone (FLONASE) 50 MCG/ACT nasal spray Place 2 sprays into both nostrils daily. 16 g 6   meclizine (ANTIVERT) 25 MG tablet Take 1 tablet (25 mg total) by mouth 3 (three) times daily as needed for dizziness. 30 tablet 0   ondansetron (ZOFRAN) 4 MG tablet Take 1 tablet (4 mg total) by mouth every 8 (eight) hours as needed for nausea or vomiting.  20 tablet 0   scopolamine (TRANSDERM-SCOP) 1 MG/3DAYS Place 1 patch (1.5 mg total) onto the skin every 3 (three) days. 10 patch 12   Semaglutide-Weight Management (WEGOVY) 2.4 MG/0.75ML SOAJ Inject 2.4 mg into the skin once a week. 3 mL 3   solifenacin (VESICARE) 10 MG tablet Take 10 mg by mouth daily.     telmisartan (MICARDIS) 40 MG tablet Take 1 tablet (40 mg total) by mouth daily. 90 tablet 1   No facility-administered medications prior to visit.    Allergies  Allergen Reactions   Codeine Hives, Itching and Nausea And Vomiting   Tramadol Nausea And Vomiting   Amethyst [Levonorgestrel-Ethinyl Estrad] Palpitations    Other symptoms--slurred speech,headache,dizziness PG CMA    Review of Systems  Constitutional:  Negative for fever and malaise/fatigue.  HENT:  Negative for congestion.   Eyes:   Negative for blurred vision.  Respiratory:  Negative for cough and shortness of breath.   Cardiovascular:  Negative for chest pain, palpitations and leg swelling (Ankles).  Gastrointestinal:  Negative for abdominal pain, blood in stool, nausea and vomiting.  Genitourinary:  Negative for dysuria and frequency.  Musculoskeletal:  Negative for back pain and falls.  Skin:  Negative for rash.  Neurological:  Negative for dizziness, loss of consciousness and headaches.  Endo/Heme/Allergies:  Negative for environmental allergies.  Psychiatric/Behavioral:  Negative for depression. The patient is not nervous/anxious.        Objective:    Physical Exam Vitals and nursing note reviewed.  Constitutional:      General: She is not in acute distress.    Appearance: Normal appearance. She is not ill-appearing.  HENT:     Head: Normocephalic and atraumatic.     Right Ear: External ear normal.     Left Ear: External ear normal.  Eyes:     Extraocular Movements: Extraocular movements intact.     Pupils: Pupils are equal, round, and reactive to light.  Cardiovascular:     Rate and Rhythm: Normal rate and regular rhythm.     Heart sounds: Normal heart sounds. No murmur heard.    No gallop.  Pulmonary:     Effort: Pulmonary effort is normal. No respiratory distress.     Breath sounds: Normal breath sounds. No wheezing or rales.  Skin:    General: Skin is warm and dry.  Neurological:     Mental Status: She is alert and oriented to person, place, and time.  Psychiatric:        Mood and Affect: Mood normal.        Judgment: Judgment normal.     BP 120/88 (BP Location: Right Arm, Patient Position: Sitting, Cuff Size: Normal)   Pulse 68   Temp 98.6 F (37 C) (Oral)   Resp 18   Ht '5\' 3"'$  (1.6 m)   Wt 191 lb 9.6 oz (86.9 kg)   BMI 33.94 kg/m  Wt Readings from Last 3 Encounters:  05/13/22 191 lb 9.6 oz (86.9 kg)  02/04/22 203 lb (92.1 kg)  01/30/22 202 lb 8 oz (91.9 kg)       Assessment  & Plan:  Essential hypertension, benign     I,Alexander Ruley,acting as a scribe for [employer], DO.,have documented all relevant documentation on the behalf of Nancy Held, DO,as directed by  Nancy Held, DO while in the presence of Nancy Held, DO.   I, Nancy Held, DO, personally preformed the services described  in this documentation.  All medical record entries made by the scribe were at my direction and in my presence.  I have reviewed the chart and discharge instructions (if applicable) and agree that the record reflects my personal performance and is accurate and complete. 05/13/22   Nancy Held, DO

## 2022-05-17 ENCOUNTER — Other Ambulatory Visit: Payer: Self-pay | Admitting: Family Medicine

## 2022-05-17 DIAGNOSIS — F419 Anxiety disorder, unspecified: Secondary | ICD-10-CM

## 2022-05-20 ENCOUNTER — Other Ambulatory Visit: Payer: Self-pay

## 2022-05-20 ENCOUNTER — Other Ambulatory Visit (HOSPITAL_BASED_OUTPATIENT_CLINIC_OR_DEPARTMENT_OTHER): Payer: Self-pay

## 2022-06-23 ENCOUNTER — Encounter: Payer: Self-pay | Admitting: Family Medicine

## 2022-06-23 ENCOUNTER — Other Ambulatory Visit: Payer: Self-pay | Admitting: Family Medicine

## 2022-06-23 DIAGNOSIS — T753XXD Motion sickness, subsequent encounter: Secondary | ICD-10-CM

## 2022-07-18 ENCOUNTER — Other Ambulatory Visit (HOSPITAL_BASED_OUTPATIENT_CLINIC_OR_DEPARTMENT_OTHER): Payer: Self-pay

## 2022-07-31 ENCOUNTER — Encounter: Payer: BC Managed Care – PPO | Admitting: Family Medicine

## 2022-08-07 ENCOUNTER — Encounter: Payer: Self-pay | Admitting: Family Medicine

## 2022-08-07 ENCOUNTER — Ambulatory Visit (INDEPENDENT_AMBULATORY_CARE_PROVIDER_SITE_OTHER): Payer: BC Managed Care – PPO | Admitting: Family Medicine

## 2022-08-07 VITALS — BP 124/68 | HR 75 | Ht 63.0 in | Wt 186.6 lb

## 2022-08-07 DIAGNOSIS — I10 Essential (primary) hypertension: Secondary | ICD-10-CM | POA: Diagnosis not present

## 2022-08-07 DIAGNOSIS — R413 Other amnesia: Secondary | ICD-10-CM | POA: Insufficient documentation

## 2022-08-07 DIAGNOSIS — K219 Gastro-esophageal reflux disease without esophagitis: Secondary | ICD-10-CM | POA: Insufficient documentation

## 2022-08-07 DIAGNOSIS — E785 Hyperlipidemia, unspecified: Secondary | ICD-10-CM | POA: Insufficient documentation

## 2022-08-07 DIAGNOSIS — Z0000 Encounter for general adult medical examination without abnormal findings: Secondary | ICD-10-CM

## 2022-08-07 LAB — COMPREHENSIVE METABOLIC PANEL
ALT: 7 U/L (ref 0–35)
AST: 14 U/L (ref 0–37)
Albumin: 4.3 g/dL (ref 3.5–5.2)
Alkaline Phosphatase: 41 U/L (ref 39–117)
BUN: 18 mg/dL (ref 6–23)
CO2: 30 mEq/L (ref 19–32)
Calcium: 9.4 mg/dL (ref 8.4–10.5)
Chloride: 105 mEq/L (ref 96–112)
Creatinine, Ser: 1.29 mg/dL — ABNORMAL HIGH (ref 0.40–1.20)
GFR: 47.06 mL/min — ABNORMAL LOW (ref >60.00)
Glucose, Bld: 89 mg/dL (ref 70–99)
Potassium: 4.1 mEq/L (ref 3.5–5.1)
Sodium: 142 mEq/L (ref 135–145)
Total Bilirubin: 0.5 mg/dL (ref 0.2–1.2)
Total Protein: 6.7 g/dL (ref 6.0–8.3)

## 2022-08-07 LAB — CBC WITH DIFFERENTIAL/PLATELET
Basophils Absolute: 0 10*3/uL (ref 0.0–0.1)
Basophils Relative: 0.8 % (ref 0.0–3.0)
Eosinophils Absolute: 0.1 10*3/uL (ref 0.0–0.7)
Eosinophils Relative: 1.6 % (ref 0.0–5.0)
HCT: 39.9 % (ref 36.0–46.0)
Hemoglobin: 13.3 g/dL (ref 12.0–15.0)
Lymphocytes Relative: 43.8 % (ref 12.0–46.0)
Lymphs Abs: 2.2 10*3/uL (ref 0.7–4.0)
MCHC: 33.4 g/dL (ref 30.0–36.0)
MCV: 86 fl (ref 78.0–100.0)
Monocytes Absolute: 0.4 10*3/uL (ref 0.1–1.0)
Monocytes Relative: 7.8 % (ref 3.0–12.0)
Neutro Abs: 2.3 10*3/uL (ref 1.4–7.7)
Neutrophils Relative %: 46 % (ref 43.0–77.0)
Platelets: 313 10*3/uL (ref 150.0–400.0)
RBC: 4.64 Mil/uL (ref 3.87–5.11)
RDW: 13.6 % (ref 11.5–15.5)
WBC: 5 10*3/uL (ref 4.0–10.5)

## 2022-08-07 LAB — LIPID PANEL
Cholesterol: 189 mg/dL (ref 0–200)
HDL: 54.1 mg/dL (ref >39.00)
LDL Cholesterol: 118 mg/dL — ABNORMAL HIGH (ref 0–99)
NonHDL: 135.07
Total CHOL/HDL Ratio: 3
Triglycerides: 85 mg/dL (ref 0.0–149.0)
VLDL: 17 mg/dL (ref 0.0–40.0)

## 2022-08-07 LAB — VITAMIN B12: Vitamin B-12: 365 pg/mL (ref 211–911)

## 2022-08-07 LAB — VITAMIN D 25 HYDROXY (VIT D DEFICIENCY, FRACTURES): VITD: 28.14 ng/mL — ABNORMAL LOW (ref 30.00–100.00)

## 2022-08-07 LAB — TSH: TSH: 1.2 u[IU]/mL (ref 0.35–5.50)

## 2022-08-07 MED ORDER — TELMISARTAN 40 MG PO TABS: 40.0000 mg | ORAL_TABLET | Freq: Every day | ORAL | 1 refills | Status: DC

## 2022-08-07 MED ORDER — AMLODIPINE BESYLATE 5 MG PO TABS: 5.0000 mg | ORAL_TABLET | Freq: Every day | ORAL | 2 refills | Status: DC

## 2022-08-07 NOTE — Progress Notes (Signed)
Subjective:   By signing my name below, I, Shehryar Baig, attest that this documentation has been prepared under the direction and in the presence of Donato Schultz, DO. 08/07/2022   Patient ID: [name], female    DOB: [birth year], 57 y.o.   MRN: [id number]  Chief Complaint  Patient presents with  . Annual Exam    HPI Patient is in today for a comprehensive physical exam.  She complains of memory loss for the past 2 years. She is having more difficulty remembers names and where she places items. She reports her family has let her know of decreased memory. She denies forget remembering directions while driving. She does notice difficulty remembering students names when prior she had no issues.  She continues using wegovy and is planning on stopped this month due to her insurance not covering her anymore.  Her breathing is stable at this time and she has not needed to use her inhaler in the past 3 months.  She denies fever, new moles, congestion, sinus pain, sore throat, chest pain, palpitations, cough, shortness of breath, wheezing, nausea, vomiting, abdominal pain, diarrhea, constipation, dysuria, frequency, hematuria, new muscle pain, new joint pain, or headaches at this time.  She has no new changes to her family medical history. She has no new surgical procedures to report.  She is UTD on shingles vaccines.  She is exercising regularly by doing a grit class 5x per week.  Mammogram was last completed 07/10/2020.  Colonoscopy was last completed 06/19/2014.  Pap smear was last completed 07/16/2021.  She is UTD on dental and vision care.    Past Medical History:  Diagnosis Date  . Asthma   . 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 Hypertension   . Menorrhagia     Past Surgical History:  Procedure Laterality Date  . LIPOMA EXCISION Left 04/03/2020   L shoulder   . ROTATOR CUFF REPAIR Right 04/03/2020   Dr  Ave Filter     Family History  Problem Relation Age of Onset  . Allergic rhinitis Mother   . Hypertension Mother   . Arthritis Mother   . Heart attack Father   . Hypertension Father   . Diabetes Father   . Asthma Sister   . Allergic rhinitis Sister   . Allergic rhinitis Brother   . Heart attack Paternal Grandfather 74    Social History   Socioeconomic History  . Marital status: Married    Spouse name: Not on file  . Number of children: Not on file  . Years of education: Not on file  . Highest education level: Not on file  Occupational History  . Not on file  Tobacco Use  . Smoking status: Never    Passive exposure: Never  . Smokeless tobacco: Never  Vaping Use  . Vaping Use: Never used  Substance and Sexual Activity  . Alcohol use: No  . Drug use: No  . Sexual activity: Not on file  Other Topics Concern  . Not on file  Social History Narrative   Exercising --- working with trainer    Social Determinants of Health   Financial Resource Strain: Not on file  Food Insecurity: Not on file  Transportation Needs: Not on file  Physical Activity: Not on file  Stress: Not on file  Social Connections: Not on file  Intimate Partner Violence: Not on file    Outpatient Medications Prior to  Visit  Medication Sig Dispense Refill  . albuterol (VENTOLIN HFA) 108 (90 Base) MCG/ACT inhaler Inhale 1-2 puffs into the lungs every 6 (six) hours as needed for wheezing or shortness of breath. 18 each 5  . amLODipine (NORVASC) 5 MG tablet TAKE 1 TABLET BY MOUTH EVERY DAY 90 tablet 2  . escitalopram (LEXAPRO) 20 MG tablet TAKE 1 TABLET BY MOUTH EVERY DAY 90 tablet 2  . fluticasone (FLONASE) 50 MCG/ACT nasal spray Place 2 sprays into both nostrils daily. 16 g 6  . meclizine (ANTIVERT) 25 MG tablet Take 1 tablet (25 mg total) by mouth 3 (three) times daily as needed for dizziness. 30 tablet 0  . ondansetron (ZOFRAN) 4 MG tablet Take 1 tablet (4 mg total) by mouth every 8 (eight) hours as  needed for nausea or vomiting. 20 tablet 0  . scopolamine (TRANSDERM-SCOP) 1 MG/3DAYS PLACE 1 PATCH ONTO THE SKIN EVERY 3 DAYS. 9 patch 14  . Semaglutide-Weight Management (WEGOVY) 2.4 MG/0.75ML SOAJ Inject 2.4 mg into the skin once a week. 3 mL 3  . solifenacin (VESICARE) 10 MG tablet Take 10 mg by mouth daily.    Marland Kitchen telmisartan (MICARDIS) 40 MG tablet Take 1 tablet (40 mg total) by mouth daily. 90 tablet 1   No facility-administered medications prior to visit.    Allergies  Allergen Reactions  . Codeine Hives, Itching and Nausea And Vomiting  . Tramadol Nausea And Vomiting  . Amethyst [Levonorgestrel-Ethinyl Estrad] Palpitations    Other symptoms--slurred speech,headache,dizziness PG CMA    Review of Systems  Constitutional:  Negative for fever.  HENT:  Negative for congestion, sinus pain and sore throat.   Respiratory:  Negative for cough, shortness of breath and wheezing.   Cardiovascular:  Negative for chest pain and palpitations.  Gastrointestinal:  Negative for abdominal pain, constipation, diarrhea, nausea and vomiting.  Genitourinary:  Negative for dysuria, frequency and hematuria.  Musculoskeletal:        (-)new muscle pain (-)new joint pain  Skin:        (-)New moles  Neurological:  Negative for headaches.  Psychiatric/Behavioral:  Positive for memory loss.        Objective:    Physical Exam Vitals and nursing note reviewed.  Constitutional:      General: She is not in acute distress.    Appearance: Normal appearance. She is not ill-appearing.  HENT:     Head: Normocephalic and atraumatic.     Right Ear: Tympanic membrane, ear canal and external ear normal.     Left Ear: Tympanic membrane, ear canal and external ear normal.  Eyes:     Extraocular Movements: Extraocular movements intact.     Pupils: Pupils are equal, round, and reactive to light.  Cardiovascular:     Rate and Rhythm: Normal rate and regular rhythm.     Heart sounds: Normal heart sounds. No  murmur heard.    No gallop.  Pulmonary:     Effort: Pulmonary effort is normal. No respiratory distress.     Breath sounds: Normal breath sounds. No wheezing or rales.  Abdominal:     General: Bowel sounds are normal. There is no distension.     Palpations: Abdomen is soft.     Tenderness: There is no abdominal tenderness. There is no guarding.  Musculoskeletal:        General: Normal range of motion.  Skin:    General: Skin is warm and dry.  Neurological:     General: No focal deficit  present.     Mental Status: She is alert and oriented to person, place, and time.  Psychiatric:        Judgment: Judgment normal.    BP 124/68 (BP Location: Right Arm, Patient Position: Sitting, Cuff Size: Large)   Pulse 75   Ht  (1.6 m)   Wt 186 lb 9.6 oz (84.6 kg)   SpO2 98%   BMI 33.05 kg/m  Wt Readings from Last 3 Encounters:  08/07/22 186 lb 9.6 oz (84.6 kg)  05/13/22 191 lb 9.6 oz (86.9 kg)  02/04/22 203 lb (92.1 kg)       Assessment & Plan:  There are no diagnoses linked to this encounter.  I, Shehryar Tyson Alias, personally preformed the services described in this documentation.  All medical record entries made by the scribe were at my direction and in my presence.  I have reviewed the chart and discharge instructions (if applicable) and agree that the record reflects my personal performance and is accurate and complete. 08/07/2022   I,Shehryar Baig,acting as a scribe for Donato Schultz, DO.,have documented all relevant documentation on the behalf of Donato Schultz, DO,as directed by  Donato Schultz, DO while in the presence of Donato Schultz, DO.   Shehryar [employer]

## 2022-08-07 NOTE — Assessment & Plan Note (Signed)
Well controlled, no changes to meds. Encouraged heart healthy diet such as the DASH diet and exercise as tolerated.  °

## 2022-08-07 NOTE — Assessment & Plan Note (Signed)
Ghm utd Check labs  See [facility] Health Maintenance  Topic Date Due   HIV Screening  Never done   COVID-19 Vaccine (5 - 2023-24 season) 12/20/2021   MAMMOGRAM  07/11/2022   INFLUENZA VACCINE  11/20/2022   COLONOSCOPY (Pts 45-65yrs Insurance coverage will need to be confirmed)  06/18/2024   PAP SMEAR-Modifier  07/16/2024   DTaP/Tdap/Td (3 - Td or Tdap) 07/25/2030   Hepatitis C Screening  Completed   Zoster Vaccines- Shingrix  Completed   HPV VACCINES  Aged Out

## 2022-08-08 LAB — HM MAMMOGRAPHY

## 2022-08-08 LAB — RPR: RPR Ser Ql: NONREACTIVE

## 2022-08-08 LAB — HM PAP SMEAR: HM Pap smear: NORMAL

## 2022-08-14 ENCOUNTER — Ambulatory Visit (HOSPITAL_COMMUNITY): Admission: RE | Admit: 2022-08-14 | Payer: BC Managed Care – PPO | Source: Ambulatory Visit

## 2022-08-21 ENCOUNTER — Other Ambulatory Visit (HOSPITAL_BASED_OUTPATIENT_CLINIC_OR_DEPARTMENT_OTHER): Payer: Self-pay

## 2022-08-21 ENCOUNTER — Other Ambulatory Visit: Payer: Self-pay | Admitting: Family Medicine

## 2022-08-21 MED ORDER — WEGOVY 2.4 MG/0.75ML ~~LOC~~ SOAJ
2.4000 mg | SUBCUTANEOUS | 3 refills | Status: DC
  Filled 2022-08-21: qty 3, 28d supply, fill #0

## 2022-08-29 ENCOUNTER — Other Ambulatory Visit (HOSPITAL_BASED_OUTPATIENT_CLINIC_OR_DEPARTMENT_OTHER): Payer: Self-pay

## 2022-09-20 ENCOUNTER — Other Ambulatory Visit: Payer: Self-pay

## 2022-09-20 ENCOUNTER — Encounter (HOSPITAL_COMMUNITY): Payer: Self-pay | Admitting: Emergency Medicine

## 2022-09-20 ENCOUNTER — Emergency Department (HOSPITAL_COMMUNITY)
Admission: EM | Admit: 2022-09-20 | Discharge: 2022-09-20 | Disposition: A | Discharge status: Home / Self Care | Payer: BC Managed Care – PPO | Attending: Emergency Medicine | Admitting: Emergency Medicine

## 2022-09-20 ENCOUNTER — Emergency Department (HOSPITAL_COMMUNITY): Payer: BC Managed Care – PPO

## 2022-09-20 DIAGNOSIS — R1110 Vomiting, unspecified: Secondary | ICD-10-CM | POA: Insufficient documentation

## 2022-09-20 DIAGNOSIS — R197 Diarrhea, unspecified: Secondary | ICD-10-CM | POA: Insufficient documentation

## 2022-09-20 DIAGNOSIS — M549 Dorsalgia, unspecified: Secondary | ICD-10-CM | POA: Diagnosis not present

## 2022-09-20 DIAGNOSIS — R1011 Right upper quadrant pain: Secondary | ICD-10-CM | POA: Insufficient documentation

## 2022-09-20 DIAGNOSIS — R109 Unspecified abdominal pain: Secondary | ICD-10-CM

## 2022-09-20 LAB — CBC WITH DIFFERENTIAL/PLATELET
Abs Immature Granulocytes: 0.01 10*3/uL (ref 0.00–0.07)
Basophils Absolute: 0 10*3/uL (ref 0.0–0.1)
Basophils Relative: 0 %
Eosinophils Absolute: 0.1 10*3/uL (ref 0.0–0.5)
Eosinophils Relative: 1 %
HCT: 39.6 % (ref 36.0–46.0)
Hemoglobin: 12.8 g/dL (ref 12.0–15.0)
Immature Granulocytes: 0 %
Lymphocytes Relative: 14 %
Lymphs Abs: 0.7 10*3/uL (ref 0.7–4.0)
MCH: 28.5 pg (ref 26.0–34.0)
MCHC: 32.3 g/dL (ref 30.0–36.0)
MCV: 88.2 fL (ref 80.0–100.0)
Monocytes Absolute: 0.4 10*3/uL (ref 0.1–1.0)
Monocytes Relative: 8 %
Neutro Abs: 3.6 10*3/uL (ref 1.7–7.7)
Neutrophils Relative %: 77 %
Platelets: 247 10*3/uL (ref 150–400)
RBC: 4.49 MIL/uL (ref 3.87–5.11)
RDW: 13.2 % (ref 11.5–15.5)
WBC: 4.7 10*3/uL (ref 4.0–10.5)
nRBC: 0 % (ref 0.0–0.2)

## 2022-09-20 LAB — LIPASE, BLOOD: Lipase: 66 U/L — ABNORMAL HIGH (ref 11–51)

## 2022-09-20 LAB — COMPREHENSIVE METABOLIC PANEL
ALT: 12 U/L (ref 0–44)
AST: 19 U/L (ref 15–41)
Albumin: 3.8 g/dL (ref 3.5–5.0)
Alkaline Phosphatase: 40 U/L (ref 38–126)
Anion gap: 8 (ref 5–15)
BUN: 18 mg/dL (ref 6–20)
CO2: 25 mmol/L (ref 22–32)
Calcium: 8.6 mg/dL — ABNORMAL LOW (ref 8.9–10.3)
Chloride: 105 mmol/L (ref 98–111)
Creatinine, Ser: 1.24 mg/dL — ABNORMAL HIGH (ref 0.44–1.00)
GFR, Estimated: 52 mL/min — ABNORMAL LOW (ref >60)
Glucose, Bld: 96 mg/dL (ref 70–99)
Potassium: 4 mmol/L (ref 3.5–5.1)
Sodium: 138 mmol/L (ref 135–145)
Total Bilirubin: 0.8 mg/dL (ref 0.3–1.2)
Total Protein: 6.9 g/dL (ref 6.5–8.1)

## 2022-09-20 LAB — C DIFFICILE QUICK SCREEN W PCR REFLEX
C Diff antigen: NEGATIVE
C Diff interpretation: NOT DETECTED
C Diff toxin: NEGATIVE

## 2022-09-20 LAB — TROPONIN I (HIGH SENSITIVITY): Troponin I (High Sensitivity): 12 ng/L (ref <18)

## 2022-09-20 LAB — D-DIMER, QUANTITATIVE: D-Dimer, Quant: 1.76 ug/mL-FEU — ABNORMAL HIGH (ref 0.00–0.50)

## 2022-09-20 MED ORDER — DICYCLOMINE HCL 20 MG PO TABS: 20.0000 mg | ORAL_TABLET | Freq: Three times a day (TID) | ORAL | 0 refills | Status: DC

## 2022-09-20 MED ORDER — FENTANYL CITRATE PF 50 MCG/ML IJ SOSY
50.0000 ug | PREFILLED_SYRINGE | Freq: Once | INTRAMUSCULAR | Status: AC
  Administered 2022-09-20: 50 ug via INTRAVENOUS
  Filled 2022-09-20: qty 1

## 2022-09-20 MED ORDER — DICYCLOMINE HCL 10 MG PO CAPS
20.0000 mg | ORAL_CAPSULE | Freq: Once | ORAL | Status: AC
  Administered 2022-09-20: 20 mg via ORAL
  Filled 2022-09-20: qty 2

## 2022-09-20 MED ORDER — HYDROCODONE-ACETAMINOPHEN 5-325 MG PO TABS: 1.0000 | ORAL_TABLET | Freq: Once | ORAL | Status: DC

## 2022-09-20 MED ORDER — IOHEXOL 350 MG/ML SOLN
75.0000 mL | Freq: Once | INTRAVENOUS | Status: AC | PRN
  Administered 2022-09-20: 75 mL via INTRAVENOUS

## 2022-09-20 MED ORDER — ONDANSETRON HCL 4 MG/2ML IJ SOLN
4.0000 mg | Freq: Once | INTRAMUSCULAR | Status: AC
  Administered 2022-09-20: 4 mg via INTRAVENOUS
  Filled 2022-09-20: qty 2

## 2022-09-20 MED ORDER — LOPERAMIDE HCL 2 MG PO CAPS
4.0000 mg | ORAL_CAPSULE | Freq: Once | ORAL | Status: AC
  Administered 2022-09-20: 4 mg via ORAL
  Filled 2022-09-20: qty 2

## 2022-09-20 MED ORDER — IBUPROFEN 800 MG PO TABS
800.0000 mg | ORAL_TABLET | Freq: Once | ORAL | Status: AC
  Administered 2022-09-20: 800 mg via ORAL
  Filled 2022-09-20: qty 1

## 2022-09-20 MED ORDER — BISMUTH SUBSALICYLATE 262 MG PO CHEW
524.0000 mg | CHEWABLE_TABLET | Freq: Once | ORAL | Status: AC
  Administered 2022-09-20: 524 mg via ORAL
  Filled 2022-09-20: qty 2

## 2022-09-20 MED ORDER — LACTATED RINGERS IV BOLUS
1000.0000 mL | Freq: Once | INTRAVENOUS | Status: AC
  Administered 2022-09-20: 1000 mL via INTRAVENOUS

## 2022-09-20 NOTE — Discharge Instructions (Addendum)
Your CT scans did not show any obvious cause of your abdominal pain.  It did show you have a possible small bowel/colon infection called enteritis, which goes along with her diarrhea.  You may take Imodium and Pepto-Bismol to help with the diarrhea.  You are being given a medicine called Bentyl to help with the abdominal pain.  You may still take ibuprofen and/or Tylenol.  If you develop fever, new or worsening abdominal pain, abdominal pain that is not going away, vomiting, blood in the stool, chest pain, or any other new/concerning symptoms then return to ER

## 2022-09-20 NOTE — ED Provider Notes (Signed)
[place] [facility] AT [facility] Provider Note   CSN: [id number] Arrival date & time: 09/20/22  7829     History  Chief Complaint  Patient presents with   Abdominal Pain   Back Pain   Diarrhea   Emesis    [name] is a 57 y.o. female.  HPI 57 year old female presents with abdominal pain.  Patient's been having on and off right-sided upper abdominal pain for years.  Comes and goes, sometimes with specific things like eating iceberg lettuce.  However, the pains been a lot worse and more consistent over the last 3 days.  Particularly worse over the last 24 hours.  It is a sharp pain and she also has pain in a similar area on her back.  No urinary symptoms or fever though she has had a little bit of chills.  Over the last 24 hours has had numerous episodes of nonbloody diarrhea as well as a couple episodes of vomiting.  She had to stop twice in the car on the way here for diarrhea and again on arrival. Pain is into her right chest and it does hurt worse to breathe and she is also short of breath though she thinks that is from the pain.  No new cough. Last menstrual cycle was 10+ years ago.  Home Medications Prior to Admission medications   Medication Sig Start Date End Date Taking? Authorizing Provider  dicyclomine (BENTYL) 20 MG tablet Take 1 tablet (20 mg total) by mouth 3 (three) times daily before meals. 09/20/22  Yes Pricilla Loveless, MD  albuterol (VENTOLIN HFA) 108 (90 Base) MCG/ACT inhaler Inhale 1-2 puffs into the lungs every 6 (six) hours as needed for wheezing or shortness of breath. 02/12/22   Seabron Spates R, DO  amLODipine (NORVASC) 5 MG tablet Take 1 tablet (5 mg total) by mouth daily. 08/07/22   Seabron Spates R, DO  escitalopram (LEXAPRO) 20 MG tablet TAKE 1 TABLET BY MOUTH EVERY DAY 05/19/22   Zola Button, Grayling Congress, DO  fluticasone (FLONASE) 50 MCG/ACT nasal spray Place 2 sprays into both nostrils daily. 01/06/22   Garnette Gunner, MD   meclizine (ANTIVERT) 25 MG tablet Take 1 tablet (25 mg total) by mouth 3 (three) times daily as needed for dizziness. 01/06/22   Garnette Gunner, MD  ondansetron (ZOFRAN) 4 MG tablet Take 1 tablet (4 mg total) by mouth every 8 (eight) hours as needed for nausea or vomiting. 01/06/22   Garnette Gunner, MD  scopolamine (TRANSDERM-SCOP) 1 MG/3DAYS PLACE 1 PATCH ONTO THE SKIN EVERY 3 DAYS. 06/23/22   Zola Button, Grayling Congress, DO  Semaglutide-Weight Management (WEGOVY) 2.4 MG/0.75ML SOAJ Inject 2.4 mg into the skin once a week. 08/21/22   Donato Schultz, DO  solifenacin (VESICARE) 10 MG tablet Take 10 mg by mouth daily. 12/29/21   [provider]  telmisartan (MICARDIS) 40 MG tablet Take 1 tablet (40 mg total) by mouth daily. 08/07/22   Donato Schultz, DO      Allergies    Codeine, Tramadol, and Amethyst [levonorgestrel-ethinyl estrad]    Review of Systems   Review of Systems  Constitutional:  Negative for fever.  Respiratory:  Positive for shortness of breath.   Cardiovascular:  Positive for chest pain.  Gastrointestinal:  Positive for abdominal pain, diarrhea and vomiting. Negative for blood in stool.  Genitourinary:  Negative for dysuria and hematuria.  Musculoskeletal:  Positive for back pain.  Physical Exam Updated Vital Signs BP 130/82 (BP Location: Right Arm)   Pulse 79   Temp 97.9 F (36.6 C) (Oral)   Resp 20   Ht 5\' 4"  (1.626 m)   Wt 81.6 kg   SpO2 100%   BMI 30.90 kg/m  Physical Exam Vitals and nursing note reviewed.  Constitutional:      General: She is not in acute distress.    Appearance: She is well-developed. She is not ill-appearing or diaphoretic.  HENT:     Head: Normocephalic and atraumatic.  Cardiovascular:     Rate and Rhythm: Normal rate and regular rhythm.     Heart sounds: Normal heart sounds.  Pulmonary:     Effort: Pulmonary effort is normal.     Breath sounds: Normal breath sounds.  Abdominal:     Palpations: Abdomen is soft.      Tenderness: There is abdominal tenderness (diffuse mild tenderness, then worse tenderness with guarding in RUQ) in the right upper quadrant. There is guarding. There is no right CVA tenderness or left CVA tenderness.  Skin:    General: Skin is warm and dry.  Neurological:     Mental Status: She is alert.     ED Results / Procedures / Treatments   Labs (all labs ordered are listed, but only abnormal results are displayed) Labs Reviewed  COMPREHENSIVE METABOLIC PANEL - Abnormal; Notable for the following components:      Result Value   Creatinine, Ser 1.24 (*)    Calcium 8.6 (*)    GFR, Estimated 52 (*)    All other components within normal limits  LIPASE, BLOOD - Abnormal; Notable for the following components:   Lipase 66 (*)    All other components within normal limits  D-DIMER, QUANTITATIVE - Abnormal; Notable for the following components:   D-Dimer, Quant 1.76 (*)    All other components within normal limits  C DIFFICILE QUICK SCREEN W PCR REFLEX    GASTROINTESTINAL PANEL BY PCR, STOOL (REPLACES STOOL CULTURE)  CBC WITH DIFFERENTIAL/PLATELET  TROPONIN I (HIGH SENSITIVITY)    EKG EKG Interpretation  Date/Time:  Saturday September 20 2022 08:48:01 EDT Ventricular Rate:  67 PR Interval:  211 QRS Duration: 88 QT Interval:  397 QTC Calculation: 420 R Axis:   55 Text Interpretation: Sinus rhythm Prolonged PR interval nonspecific T waves similar to 2017 Confirmed by Pricilla Loveless [phone]) on 09/20/2022 9:28:43 AM  Radiology CT ABDOMEN PELVIS W CONTRAST  Result Date: 09/20/2022 CLINICAL DATA:  Pulmonary embolism (PE) suspected, low to intermediate prob, positive D-dimer; RLQ abdominal pain EXAM: CT ANGIOGRAPHY CHEST CT ABDOMEN AND PELVIS WITH CONTRAST TECHNIQUE: Multidetector CT imaging of the chest was performed using the standard protocol during bolus administration of intravenous contrast. Multiplanar CT image reconstructions and MIPs were obtained to evaluate the vascular  anatomy. Multidetector CT imaging of the abdomen and pelvis was performed using the standard protocol during bolus administration of intravenous contrast. RADIATION DOSE REDUCTION: This exam was performed according to the departmental dose-optimization program which includes automated exposure control, adjustment of the mA and/or kV according to patient size and/or use of iterative reconstruction technique. CONTRAST:  75mL OMNIPAQUE IOHEXOL 350 MG/ML SOLN COMPARISON:  None Available. FINDINGS: CTA CHEST FINDINGS Cardiovascular: SVC patent. Heart size upper limits normal. Trace pericardial fluid. The RV is nondilated. Satisfactory opacification of pulmonary arteries noted, and there is no evidence of pulmonary emboli. Adequate contrast opacification of the thoracic aorta with no evidence of dissection, aneurysm, or stenosis. Mediastinum/Nodes:  No mass or adenopathy. Lungs/Pleura: No pleural effusion. No pneumothorax. Relatively low lung volumes without worrisome nodule or significant airspace disease. Musculoskeletal: No chest wall abnormality. No acute or significant osseous findings. Review of the MIP images confirms the above findings. CT ABDOMEN and PELVIS FINDINGS Hepatobiliary: Subcentimeter probable cyst in hepatic segment 8; no follow-up warranted. No worrisome liver lesion or biliary ductal dilatation. Gallbladder incompletely distended, without calcified gallstones. Pancreas: Unremarkable. No pancreatic ductal dilatation or surrounding inflammatory changes. Spleen: Normal in size without focal abnormality. Adrenals/Urinary Tract: Adrenal glands are unremarkable. Kidneys are normal, without renal calculi, focal lesion, or hydronephrosis. Bladder is unremarkable. Stomach/Bowel: Stomach is nondistended. Small bowel decompressed. There is prominent mucosal enhancement in the terminal ileum and proximal ascending colon, without adjacent inflammatory/edematous change. Normal appendix. Prominent right lower  quadrant mesenteric lymph nodes up to 1 cm short axis diameter. The remainder of the colon is partially distended by gas and fluid, without acute finding. Vascular/Lymphatic: No significant vascular findings. Prominent right lower quadrant lymph nodes up to 1 cm short axis diameter. Reproductive: Uterus and bilateral adnexa are unremarkable. Other: Bilateral pelvic phleboliths. Trace free fluid in the cul-de-sac which can be physiologic in menstruating females. No abdominal ascites. No free air. Musculoskeletal: Early bilateral hip degenerative change. No fracture or worrisome bone lesion. Review of the MIP images confirms the above findings. IMPRESSION: 1. Negative for acute PE or thoracic aortic dissection. 2. Normal appendix. 3. Prominent mucosal enhancement in the terminal ileum and proximal ascending colon, with prominent right lower quadrant mesenteric lymph nodes. Correlate with any clinical or laboratory evidence of enteritis/colitis. Electronically Signed   By: Corlis Leak M.D.   On: 09/20/2022 12:59   CT Angio Chest PE W and/or Wo Contrast  Result Date: 09/20/2022 CLINICAL DATA:  Pulmonary embolism (PE) suspected, low to intermediate prob, positive D-dimer; RLQ abdominal pain EXAM: CT ANGIOGRAPHY CHEST CT ABDOMEN AND PELVIS WITH CONTRAST TECHNIQUE: Multidetector CT imaging of the chest was performed using the standard protocol during bolus administration of intravenous contrast. Multiplanar CT image reconstructions and MIPs were obtained to evaluate the vascular anatomy. Multidetector CT imaging of the abdomen and pelvis was performed using the standard protocol during bolus administration of intravenous contrast. RADIATION DOSE REDUCTION: This exam was performed according to the departmental dose-optimization program which includes automated exposure control, adjustment of the mA and/or kV according to patient size and/or use of iterative reconstruction technique. CONTRAST:  75mL OMNIPAQUE IOHEXOL 350  MG/ML SOLN COMPARISON:  None Available. FINDINGS: CTA CHEST FINDINGS Cardiovascular: SVC patent. Heart size upper limits normal. Trace pericardial fluid. The RV is nondilated. Satisfactory opacification of pulmonary arteries noted, and there is no evidence of pulmonary emboli. Adequate contrast opacification of the thoracic aorta with no evidence of dissection, aneurysm, or stenosis. Mediastinum/Nodes: No mass or adenopathy. Lungs/Pleura: No pleural effusion. No pneumothorax. Relatively low lung volumes without worrisome nodule or significant airspace disease. Musculoskeletal: No chest wall abnormality. No acute or significant osseous findings. Review of the MIP images confirms the above findings. CT ABDOMEN and PELVIS FINDINGS Hepatobiliary: Subcentimeter probable cyst in hepatic segment 8; no follow-up warranted. No worrisome liver lesion or biliary ductal dilatation. Gallbladder incompletely distended, without calcified gallstones. Pancreas: Unremarkable. No pancreatic ductal dilatation or surrounding inflammatory changes. Spleen: Normal in size without focal abnormality. Adrenals/Urinary Tract: Adrenal glands are unremarkable. Kidneys are normal, without renal calculi, focal lesion, or hydronephrosis. Bladder is unremarkable. Stomach/Bowel: Stomach is nondistended. Small bowel decompressed. There is prominent mucosal enhancement in the terminal ileum and  proximal ascending colon, without adjacent inflammatory/edematous change. Normal appendix. Prominent right lower quadrant mesenteric lymph nodes up to 1 cm short axis diameter. The remainder of the colon is partially distended by gas and fluid, without acute finding. Vascular/Lymphatic: No significant vascular findings. Prominent right lower quadrant lymph nodes up to 1 cm short axis diameter. Reproductive: Uterus and bilateral adnexa are unremarkable. Other: Bilateral pelvic phleboliths. Trace free fluid in the cul-de-sac which can be physiologic in  menstruating females. No abdominal ascites. No free air. Musculoskeletal: Early bilateral hip degenerative change. No fracture or worrisome bone lesion. Review of the MIP images confirms the above findings. IMPRESSION: 1. Negative for acute PE or thoracic aortic dissection. 2. Normal appendix. 3. Prominent mucosal enhancement in the terminal ileum and proximal ascending colon, with prominent right lower quadrant mesenteric lymph nodes. Correlate with any clinical or laboratory evidence of enteritis/colitis. Electronically Signed   By: Corlis Leak M.D.   On: 09/20/2022 12:59   US Abdomen Limited RUQ (LIVER/GB)  Result Date: 09/20/2022 CLINICAL DATA:  Right upper quadrant pain. EXAM: ULTRASOUND ABDOMEN LIMITED RIGHT UPPER QUADRANT COMPARISON:  None Available. FINDINGS: Gallbladder: Gallbladder moderately distended. At least 2 small polyps are noted, largest 5 mm. No shadowing stones. No wall thickening or pericholecystic fluid. Common bile duct: Diameter: 5 mm Liver: Mild increase in liver parenchymal echogenicity. Normal liver size. No mass. Portal vein is patent on color Doppler imaging with normal direction of blood flow towards the liver. Other: None. IMPRESSION: 1. No acute findings. 2. Small gallbladder polyps. No stones or evidence of acute cholecystitis. 3. Mild increased liver parenchymal echogenicity suggests hepatic steatosis. Electronically Signed   By: Amie Portland M.D.   On: 09/20/2022 09:17   DG Chest Portable 1 View  Result Date: 09/20/2022 CLINICAL DATA:  57 year old female with history of right lower chest pain. EXAM: PORTABLE CHEST 1 VIEW COMPARISON:  Chest x-ray 08/22/2015. FINDINGS: Lung volumes are normal. No consolidative airspace disease. No pleural effusions. No pneumothorax. No pulmonary nodule or mass noted. Pulmonary vasculature and the cardiomediastinal silhouette are within normal limits. IMPRESSION: No radiographic evidence of acute cardiopulmonary disease. Electronically Signed    By: Trudie Reed M.D.   On: 09/20/2022 09:05    Procedures Procedures    Medications Ordered in ED Medications  bismuth subsalicylate (PEPTO BISMOL) chewable tablet 524 mg (has no administration in time range)  fentaNYL (SUBLIMAZE) injection 50 mcg (50 mcg Intravenous Given 09/20/22 0911)  ondansetron (ZOFRAN) injection 4 mg (4 mg Intravenous Given 09/20/22 0912)  lactated ringers bolus 1,000 mL (1,000 mLs Intravenous New Bag/Given 09/20/22 0920)  iohexol (OMNIPAQUE) 350 MG/ML injection 75 mL (75 mLs Intravenous Contrast Given 09/20/22 1242)  dicyclomine (BENTYL) capsule 20 mg (20 mg Oral Given 09/20/22 1353)  ibuprofen (ADVIL) tablet 800 mg (800 mg Oral Given 09/20/22 1552)  loperamide (IMODIUM) capsule 4 mg (4 mg Oral Given 09/20/22 1552)    ED Course/ Medical Decision Making/ A&P Clinical Course as of 09/20/22 1604  Sat Sep 20, 2022  0927 RUQ u/s without significant findings. No cholecystitis. Will need abdominal CT. Given pleuritic symptoms, will add on Ddimer.  [SG]    Clinical Course User Index [SG] Pricilla Loveless, MD                             Medical Decision Making Amount and/or Complexity of Data Reviewed Labs: ordered.    Details: Normal WBC, unremarkable LFTs.  Elevated D-dimer. Radiology: ordered.  Details: No cholecystitis.  No appendicitis.  No PE ECG/medicine tests: ordered and independent interpretation performed.    Details: No acute ischemia  Risk OTC drugs. Prescription drug management.   No clear cause for the patient's symptoms.  She is continue to have some diarrhea though it slowed down here.  She has some inflammation of the terminal ileum and so perhaps enteritis is causing pain in this right side which would go along with her symptoms.  At this point I do not know that antibiotics are warranted.  Will send GI pathogen panel and C. difficile testing.  Vital signs are reassuring.  Pain has improved though was starting to come back.  Patient has declined  any type of narcotics for pain.  Was given Bentyl.  Later was asking more questions and asking for a little better pain control and so she was given ibuprofen as well as Imodium and Pepto-Bismol.  Still declines anything stronger for pain.  I have lower suspicion for mesenteric ischemia, bowel perforation, etc.  I do not think this is a cardiac cause.  No PE on the testing.  At this point, she feels well enough for discharge and will have her follow-up with her PCP and her gastroenterologist.  Given return precautions.        Final Clinical Impression(s) / ED Diagnoses Final diagnoses:  Right sided abdominal pain    Rx / DC Orders ED Discharge Orders          Ordered    dicyclomine (BENTYL) 20 MG tablet  3 times daily before meals        09/20/22 1340              Pricilla Loveless, MD 09/20/22 1606

## 2022-09-20 NOTE — ED Triage Notes (Signed)
Pt came in because of experiencing right upper abdominal pain that radiates to the back and rib cage, and vomiting and diarrhea. She has been experiencing the abdominal pain for a year, for last 3 day the pain been getting worse. The nausea and severe diarrhea been going on for 24 hours. Patient report having chills. She report the pain in her abdomen is sharp. Pain is 8/10. Pt is A & O X 4.

## 2022-09-21 LAB — GASTROINTESTINAL PANEL BY PCR, STOOL (REPLACES STOOL CULTURE)
Cyclospora cayetanensis: NOT DETECTED
Entamoeba histolytica: NOT DETECTED
Enteroaggregative E coli (EAEC): DETECTED — AB
Enterotoxigenic E coli (ETEC): DETECTED — AB
Giardia lamblia: NOT DETECTED
Plesimonas shigelloides: NOT DETECTED
Sapovirus (I, II, IV, and V): NOT DETECTED

## 2022-09-22 ENCOUNTER — Encounter: Payer: Self-pay | Admitting: Family Medicine

## 2022-09-22 ENCOUNTER — Ambulatory Visit: Payer: BC Managed Care – PPO | Admitting: Family Medicine

## 2022-09-22 VITALS — BP 118/62 | HR 62 | Ht 64.0 in | Wt 184.2 lb

## 2022-09-22 DIAGNOSIS — A058 Other specified bacterial foodborne intoxications: Secondary | ICD-10-CM | POA: Diagnosis not present

## 2022-09-22 DIAGNOSIS — R1084 Generalized abdominal pain: Secondary | ICD-10-CM | POA: Diagnosis not present

## 2022-09-22 DIAGNOSIS — R110 Nausea: Secondary | ICD-10-CM | POA: Diagnosis not present

## 2022-09-22 LAB — POCT URINALYSIS DIPSTICK
Blood, UA: NEGATIVE
Glucose, UA: NEGATIVE
Leukocytes, UA: NEGATIVE
Nitrite, UA: NEGATIVE
Protein, UA: NEGATIVE
Spec Grav, UA: 1.015 (ref 1.010–1.025)
Urobilinogen, UA: 0.2 E.U./dL
pH, UA: 6 (ref 5.0–8.0)

## 2022-09-22 LAB — GASTROINTESTINAL PANEL BY PCR, STOOL (REPLACES STOOL CULTURE)
Adenovirus F40/41: NOT DETECTED
Astrovirus: NOT DETECTED
Campylobacter species: NOT DETECTED
Cryptosporidium: NOT DETECTED
Enteropathogenic E coli (EPEC): DETECTED — AB
Norovirus GI/GII: NOT DETECTED
Rotavirus A: NOT DETECTED
Salmonella species: NOT DETECTED
Shiga like toxin producing E coli (STEC): NOT DETECTED
Shigella/Enteroinvasive E coli (EIEC): NOT DETECTED
Vibrio cholerae: NOT DETECTED
Vibrio species: NOT DETECTED
Yersinia enterocolitica: NOT DETECTED

## 2022-09-22 MED ORDER — CIPROFLOXACIN HCL 500 MG PO TABS
500.0000 mg | ORAL_TABLET | Freq: Two times a day (BID) | ORAL | 0 refills | Status: AC
Start: 2022-09-22 — End: 2022-09-25

## 2022-09-22 MED ORDER — PROMETHAZINE HCL 25 MG/ML IJ SOLN
25.0000 mg | Freq: Once | INTRAMUSCULAR | Status: AC
Start: 2022-09-22 — End: 2022-09-22
  Administered 2022-09-22: 25 mg via INTRAMUSCULAR

## 2022-09-22 MED ORDER — PROMETHAZINE HCL 25 MG/ML IJ SOLN
25.0000 mg | Freq: Once | INTRAMUSCULAR | Status: DC
Start: 2022-09-22 — End: 2022-09-22

## 2022-09-22 MED ORDER — ONDANSETRON HCL 4 MG PO TABS: 4.0000 mg | ORAL_TABLET | Freq: Three times a day (TID) | ORAL | 0 refills | Status: DC | PRN

## 2022-09-22 NOTE — Progress Notes (Signed)
Established Patient Office Visit  Subjective   Patient ID: [name], female    DOB: 1965-06-28  Age: 57 y.o. MRN: [id number]  Chief Complaint  Patient presents with   Abdominal Pain    Right side pain  Possible ECOLI  Onset 09/19/2022 after eating breakfast at 4:30pm abdominal pain started     HPI Pt here c/o diarrhea and nausea since 09/19/22--- she went to er 6/1 and stool samples collected and came back + Ecoli  The diarrhea has slowed down but she still has significant pain and nausea . Patient Active Problem List   Diagnosis Date Noted   Generalized abdominal pain 09/22/2022   Food poisoning due to Escherichia coli 09/22/2022   Morbid obesity (HCC) 08/07/2022   Gastroesophageal reflux disease 08/07/2022   Memory loss 08/07/2022   Hyperlipidemia 08/07/2022   COVID 01/06/2022   Preventative health care 07/29/2021   Hot flashes due to menopause 09/14/2019   Anxiety 09/14/2019   Depression with anxiety 04/07/2019   Essential hypertension 05/24/2018   Class 1 obesity due to excess calories with serious comorbidity and body mass index (BMI) of 32.0 to 32.9 in adult 05/24/2018   Overactive bladder 10/28/2011    Class: History of   Fibroadenoma of breast, left    Menorrhagia 08/13/2011   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   bilateral mammography and left breast U/S in 6 months   Hypertension    Menorrhagia    Past Surgical History:  Procedure Laterality Date   LIPOMA EXCISION Left 04/03/2020   L shoulder    ROTATOR CUFF REPAIR Right 04/03/2020   Dr Ave Filter    Social History   Tobacco Use   Smoking status: Never    Passive exposure: Never   Smokeless tobacco: Never  Vaping Use   Vaping Use: Never used  Substance Use Topics   Alcohol use: No   Drug use: No   Social History   Socioeconomic History   Marital status: Married    Spouse name: Not on file   Number of children: Not on file   Years  of education: Not on file   Highest education level: Doctorate  Occupational History   Not on file  Tobacco Use   Smoking status: Never    Passive exposure: Never   Smokeless tobacco: Never  Vaping Use   Vaping Use: Never used  Substance and Sexual Activity   Alcohol use: No   Drug use: No   Sexual activity: Not on file  Other Topics Concern   Not on file  Social History Narrative   Exercising --- working with trainer , Therapist, art class at [employer]   Financial Resource Strain: Low Risk  (09/21/2022)   Overall Financial Resource Strain (CARDIA)    Difficulty of Paying Living Expenses: Not hard at all  Food Insecurity: No Food Insecurity (09/21/2022)   Hunger Vital Sign    Worried About Running Out of Food in the Last Year: Never true    Ran Out of Food in the Last Year: Never true  Transportation Needs: No Transportation Needs (09/21/2022)   PRAPARE - Administrator, Civil Service (Medical): No    Lack of Transportation (Non-Medical): No  Physical Activity: Sufficiently Active (09/21/2022)   Exercise Vital Sign    Days of Exercise per Week: 4 days    Minutes of Exercise per Session: 60 min  Stress: No Stress Concern Present (09/21/2022)   [employer] of Occupational Health - Occupational Stress Questionnaire    Feeling of Stress : Only a little  Social Connections: Socially Integrated (09/21/2022)   Social Connection and Isolation Panel [NHANES]    Frequency of Communication with Friends and Family: More than three times a week    Frequency of Social Gatherings with Friends and Family: Three times a week    Attends Religious Services: More than 4 times per year    Active Member of Clubs or Organizations: Yes    Attends Engineer, structural: More than 4 times per year    Marital Status: Married  Catering manager Violence: Not on file   Family Status  Relation Name Status   Mother  Alive   Father  Deceased   Sister  (Not Specified)    Brother  (Not Specified)   PGF  (Not Specified)   Family History  Problem Relation Age of Onset   Allergic rhinitis Mother    Hypertension Mother    Arthritis Mother    Heart attack Father    Hypertension Father    Diabetes Father    Asthma Sister    Allergic rhinitis Sister    Allergic rhinitis Brother    Heart attack Paternal Grandfather 70   Allergies  Allergen Reactions   Codeine Hives, Itching and Nausea And Vomiting   Tramadol Nausea And Vomiting   Amethyst [Levonorgestrel-Ethinyl Estrad] Palpitations    Other symptoms--slurred speech,headache,dizziness PG CMA      Review of Systems  Constitutional:  Negative for fever and malaise/fatigue.  HENT:  Negative for congestion.   Eyes:  Negative for blurred vision.  Respiratory:  Negative for cough and shortness of breath.   Cardiovascular:  Negative for chest pain, palpitations and leg swelling.  Gastrointestinal:  Negative for abdominal pain, blood in stool, nausea and vomiting.  Genitourinary:  Negative for dysuria and frequency.  Musculoskeletal:  Negative for back pain and falls.  Skin:  Negative for rash.  Neurological:  Negative for dizziness, loss of consciousness and headaches.  Endo/Heme/Allergies:  Negative for environmental allergies.  Psychiatric/Behavioral:  Negative for depression. The patient is not nervous/anxious.       Objective:     BP 118/62 (BP Location: Left Arm, Patient Position: Sitting, Cuff Size: Normal)   Pulse 62   Ht 5\' 4"  (1.626 m)   Wt 184 lb 3.2 oz (83.6 kg)   SpO2 98%   BMI 31.62 kg/m  BP Readings from Last 3 Encounters:  09/22/22 118/62  09/20/22 122/82  08/07/22 124/68   Wt Readings from Last 3 Encounters:  09/22/22 184 lb 3.2 oz (83.6 kg)  09/20/22 180 lb (81.6 kg)  08/07/22 186 lb 9.6 oz (84.6 kg)   SpO2 Readings from Last 3 Encounters:  09/22/22 98%  09/20/22 98%  08/07/22 98%      Physical Exam Vitals and nursing note reviewed.  Constitutional:       Appearance: She is well-developed.  HENT:     Head: Normocephalic and atraumatic.  Eyes:     Conjunctiva/sclera: Conjunctivae normal.  Neck:     Thyroid: No thyromegaly.     Vascular: No carotid bruit or JVD.  Cardiovascular:     Rate and Rhythm: Normal rate and regular rhythm.     Heart sounds: Normal heart sounds. No murmur heard. Pulmonary:     Effort: Pulmonary effort is normal. No respiratory distress.     Breath sounds: Normal breath  sounds. No wheezing or rales.  Chest:     Chest wall: No tenderness.  Abdominal:     General: Abdomen is flat. Bowel sounds are normal.     Palpations: Abdomen is soft.     Tenderness: There is generalized abdominal tenderness. There is no guarding or rebound.  Musculoskeletal:     Cervical back: Normal range of motion and neck supple.  Neurological:     Mental Status: She is alert and oriented to person, place, and time.      Results for orders placed or performed in visit on 09/22/22  POCT Urinalysis Dipstick  Result Value Ref Range   Color, UA Yellow    Clarity, UA Cloudy    Glucose, UA Negative Negative   Bilirubin, UA Small    Ketones, UA Trace    Spec Grav, UA 1.015 1.010 - 1.025   Blood, UA Negative    pH, UA 6.0 5.0 - 8.0   Protein, UA Negative Negative   Urobilinogen, UA 0.2 0.2 or 1.0 E.U./dL   Nitrite, UA Negative    Leukocytes, UA Negative Negative   Appearance     Odor      Last CBC Lab Results  Component Value Date   WBC 4.7 09/20/2022   HGB 12.8 09/20/2022   HCT 39.6 09/20/2022   MCV 88.2 09/20/2022   MCH 28.5 09/20/2022   RDW 13.2 09/20/2022   PLT 247 09/20/2022   Last metabolic panel Lab Results  Component Value Date   GLUCOSE 96 09/20/2022   NA 138 09/20/2022   K 4.0 09/20/2022   CL 105 09/20/2022   CO2 25 09/20/2022   BUN 18 09/20/2022   CREATININE 1.24 (H) 09/20/2022   GFRNONAA 52 (L) 09/20/2022   CALCIUM 8.6 (L) 09/20/2022   PROT 6.9 09/20/2022   ALBUMIN 3.8 09/20/2022   LABGLOB 2.4  05/24/2018   AGRATIO 1.9 05/24/2018   BILITOT 0.8 09/20/2022   ALKPHOS 40 09/20/2022   AST 19 09/20/2022   ALT 12 09/20/2022   ANIONGAP 8 09/20/2022   Last lipids Lab Results  Component Value Date   CHOL 189 08/07/2022   HDL 54.10 08/07/2022   LDLCALC 118 (H) 08/07/2022   TRIG 85.0 08/07/2022   CHOLHDL 3 08/07/2022   Last hemoglobin A1c Lab Results  Component Value Date   HGBA1C 6.2 02/04/2022   Last thyroid functions Lab Results  Component Value Date   TSH 1.20 08/07/2022   Last vitamin D Lab Results  Component Value Date   VD25OH 28.14 (L) 08/07/2022   Last vitamin B12 and Folate Lab Results  Component Value Date   VITAMINB12 365 08/07/2022      The 10-year ASCVD risk score (Arnett DK, et al., 2019) is: 3.3%    Assessment & Plan:   Problem List Items Addressed This Visit       Unprioritized   Food poisoning due to Escherichia coli - Primary    Phenergan injection given Zofran  Cipro bid for 3 days  Return to office / er if no improvement       Relevant Medications   ciprofloxacin (CIPRO) 500 MG tablet   ondansetron (ZOFRAN) 4 MG tablet   Generalized abdominal pain   Relevant Orders   POCT Urinalysis Dipstick (Completed)    Return if symptoms worsen or fail to improve.    Donato Schultz, DO

## 2022-09-22 NOTE — Patient Instructions (Signed)
Food Poisoning Food poisoning is an illness that is caused by eating or drinking contaminated foods or drinks. In most cases, food poisoning is mild and lasts 2-7 days. However, some cases can be serious, especially for people who have a weak body defense system (immune system), like older people, children and infants, and pregnant women. What are the causes? This condition is caused by contaminated food or drinks. Foods can become contaminated with viruses, bacteria, parasites, or mold due to: Poor personal hygiene, such as poor hand-washing practices. Storing food improperly, such as not refrigerating raw meat. Using unclean surfaces for preparing, serving, and storing food. Cooking or eating with unclean utensils. Not cooking food to the right temperature. If contaminated food is eaten, viruses, bacteria, or parasites can harm the intestine. This often causes severe diarrhea. The most common causes of food poisoning include: Viruses, such as: Norovirus. Rotavirus. Bacteria, such as: Salmonella. Listeria. E. coli (Escherichia coli). Parasites, such as: Giardia. Toxoplasma gondii. What are the signs or symptoms? Symptoms may take several hours to appear after you consume contaminated food or drink. Symptoms include: Nausea. Vomiting. Cramping or abdominal pain. Diarrhea. Fever and chills. Muscle aches. Dehydration. Dehydration can cause you to be tired and thirsty, have a dry mouth, and urinate less frequently. How is this diagnosed? Your health care provider can diagnose food poisoning with your medical history and a physical exam. This will include asking you what you have recently eaten. You may also have tests, including: Blood tests. Stool tests. How is this treated? Treatment focuses on relieving your symptoms and making sure that you are hydrated. You may also be given medicines. In severe cases, hospitalization may be required and you may need to receive fluids through an  IV. Follow these instructions at home: Eating and drinking  Drink enough fluids to keep your urine pale yellow. You may need to drink small amounts of clear liquids frequently. Avoid milk, caffeine, and alcohol. Ask your health care provider for specific rehydration instructions. Eat small, frequent meals rather than large meals. Medicines Take over-the-counter and prescription medicines only as told by your health care provider. Ask your health care provider if you should continue to take any of your regular prescribed and over-the-counter medicines. If you were prescribed an antibiotic medicine, take it as told by your health care provider. Do not stop using the antibiotic even if you start to feel better. General instructions  Wash your hands with soap and water for at least 20 seconds before you prepare food and after you go to the bathroom (use the toilet). Make sure that the people who live with you also wash their hands often. Rest at home until you feel better. Clean surfaces that you touch with a product that contains chlorine bleach. Keep all follow-up visits. This is important. How is this prevented? Wash your hands, food preparation surfaces, and utensils thoroughly before and after you handle raw foods. Use separate food preparation surfaces and storage spaces for raw meat and for fruits and vegetables. Keep refrigerated foods colder than 67F (5C). Serve hot foods immediately or keep them heated above 167F (60C). Store dry foods in cool, dry spaces away from excess heat or moisture. Throw out any foods that do not smell right or are in cans that are bulging. Heat canned foods thoroughly before you taste them. Drink bottled or sterile water when you travel. Get help right away if: You have difficulty breathing, swallowing, talking, or moving. You develop blurred vision. You faint. Your  eyes turn yellow. Your vomiting or diarrhea is persistent or it affects your eating  and drinking. Abdominal pain develops, increases, or localizes in one small area. You have a fever. You have blood or mucus in your stools, or your stools look dark black and tarry. You have signs of dehydration, such as: Dark urine, very little urine, or no urine. Not making tears while crying. Dry mouth or lips. Sunken eyes. Sleepiness. Weakness. Dizziness. These symptoms may be an emergency. Get help right away. Call 911. Do not wait to see if the symptoms will go away. Do not drive yourself to the hospital. Summary Food poisoning is an illness that is caused by eating or drinking contaminated foods or drinks. Symptoms may include nausea, vomiting, diarrhea, muscle aches, cramping, fever, chills, and dehydration. In most cases, food poisoning is mild and lasts 2-7 days. In severe cases, hospitalization may be required. This information is not intended to replace advice given to you by your health care provider. Make sure you discuss any questions you have with your health care provider. Document Revised: 02/25/2021 Document Reviewed: 02/25/2021 Elsevier Patient Education  2024 [employer].

## 2022-09-22 NOTE — Assessment & Plan Note (Signed)
Phenergan injection given Zofran  Cipro bid for 3 days  Return to office / er if no improvement

## 2022-10-14 ENCOUNTER — Ambulatory Visit: Payer: BC Managed Care – PPO | Admitting: Neurology

## 2022-10-16 ENCOUNTER — Other Ambulatory Visit (HOSPITAL_BASED_OUTPATIENT_CLINIC_OR_DEPARTMENT_OTHER): Payer: Self-pay

## 2022-10-16 MED ORDER — MIRABEGRON ER 50 MG PO TB24
50.0000 mg | ORAL_TABLET | Freq: Every day | ORAL | 3 refills | Status: AC
  Filled 2022-10-16: qty 90, 90d supply, fill #0
  Filled 2023-01-08: qty 90, 90d supply, fill #1

## 2022-10-17 ENCOUNTER — Other Ambulatory Visit (HOSPITAL_BASED_OUTPATIENT_CLINIC_OR_DEPARTMENT_OTHER): Payer: Self-pay

## 2022-12-03 ENCOUNTER — Ambulatory Visit: Payer: BC Managed Care – PPO | Admitting: Neurology

## 2022-12-17 ENCOUNTER — Encounter: Payer: Self-pay | Admitting: Family Medicine

## 2023-01-08 ENCOUNTER — Other Ambulatory Visit: Payer: Self-pay

## 2023-01-08 ENCOUNTER — Other Ambulatory Visit (HOSPITAL_BASED_OUTPATIENT_CLINIC_OR_DEPARTMENT_OTHER): Payer: Self-pay

## 2023-01-12 ENCOUNTER — Other Ambulatory Visit (HOSPITAL_BASED_OUTPATIENT_CLINIC_OR_DEPARTMENT_OTHER): Payer: Self-pay

## 2023-01-21 ENCOUNTER — Other Ambulatory Visit: Payer: Self-pay | Admitting: Orthopedic Surgery

## 2023-01-21 ENCOUNTER — Other Ambulatory Visit (HOSPITAL_BASED_OUTPATIENT_CLINIC_OR_DEPARTMENT_OTHER): Payer: Self-pay

## 2023-01-21 DIAGNOSIS — S83242A Other tear of medial meniscus, current injury, left knee, initial encounter: Secondary | ICD-10-CM

## 2023-01-23 ENCOUNTER — Other Ambulatory Visit (HOSPITAL_BASED_OUTPATIENT_CLINIC_OR_DEPARTMENT_OTHER): Payer: Self-pay

## 2023-01-28 ENCOUNTER — Other Ambulatory Visit: Payer: Self-pay | Admitting: Family Medicine

## 2023-01-28 ENCOUNTER — Ambulatory Visit
Admission: RE | Admit: 2023-01-28 | Discharge: 2023-01-28 | Disposition: A | Discharge status: Home / Self Care | Payer: BC Managed Care – PPO | Source: Ambulatory Visit | Attending: Orthopedic Surgery

## 2023-01-28 DIAGNOSIS — S83242A Other tear of medial meniscus, current injury, left knee, initial encounter: Secondary | ICD-10-CM

## 2023-01-28 DIAGNOSIS — E785 Hyperlipidemia, unspecified: Secondary | ICD-10-CM

## 2023-01-28 DIAGNOSIS — I10 Essential (primary) hypertension: Secondary | ICD-10-CM

## 2023-01-28 DIAGNOSIS — F419 Anxiety disorder, unspecified: Secondary | ICD-10-CM

## 2023-03-24 ENCOUNTER — Other Ambulatory Visit (HOSPITAL_BASED_OUTPATIENT_CLINIC_OR_DEPARTMENT_OTHER): Payer: Self-pay

## 2023-05-25 NOTE — Telephone Encounter (Signed)
 Appt scheduled tomorrow

## 2023-05-26 ENCOUNTER — Ambulatory Visit (HOSPITAL_BASED_OUTPATIENT_CLINIC_OR_DEPARTMENT_OTHER)
Admission: RE | Admit: 2023-05-26 | Discharge: 2023-05-26 | Disposition: A | Discharge status: Home / Self Care | Payer: 59 | Source: Ambulatory Visit | Attending: Family Medicine | Admitting: Family Medicine

## 2023-05-26 ENCOUNTER — Encounter: Payer: Self-pay | Admitting: Family Medicine

## 2023-05-26 ENCOUNTER — Ambulatory Visit: Payer: Self-pay | Admitting: Family Medicine

## 2023-05-26 ENCOUNTER — Other Ambulatory Visit (HOSPITAL_BASED_OUTPATIENT_CLINIC_OR_DEPARTMENT_OTHER): Payer: Self-pay

## 2023-05-26 VITALS — BP 110/70 | HR 85 | Temp 98.8°F | Resp 18 | Ht 64.0 in | Wt 198.2 lb

## 2023-05-26 DIAGNOSIS — J029 Acute pharyngitis, unspecified: Secondary | ICD-10-CM | POA: Diagnosis not present

## 2023-05-26 DIAGNOSIS — J101 Influenza due to other identified influenza virus with other respiratory manifestations: Secondary | ICD-10-CM | POA: Diagnosis present

## 2023-05-26 DIAGNOSIS — R6889 Other general symptoms and signs: Secondary | ICD-10-CM

## 2023-05-26 DIAGNOSIS — R051 Acute cough: Secondary | ICD-10-CM | POA: Diagnosis present

## 2023-05-26 LAB — POC INFLUENZA A&B (BINAX/QUICKVUE)
Influenza A, POC: POSITIVE — AB
Influenza B, POC: NEGATIVE

## 2023-05-26 LAB — POC COVID19 BINAXNOW: SARS Coronavirus 2 Ag: NEGATIVE

## 2023-05-26 LAB — POCT RAPID STREP A (OFFICE): Rapid Strep A Screen: NEGATIVE

## 2023-05-26 MED ORDER — OSELTAMIVIR PHOSPHATE 75 MG PO CAPS
75.0000 mg | ORAL_CAPSULE | Freq: Two times a day (BID) | ORAL | 0 refills | Status: DC
Start: 2023-05-26 — End: 2023-08-13
  Filled 2023-05-26: qty 10, 5d supply, fill #0

## 2023-05-26 NOTE — Progress Notes (Signed)
 Established Patient Office Visit  Subjective   Patient ID: [name], female    DOB: 1965-06-22  Age: 58 y.o. MRN: [id number]  Chief Complaint  Patient presents with   Cough    Sxs started Saturday, no COVID test. Bad dry cough, ribs hurt, ears ringing    HPI Discussed the use of AI scribe software for clinical note transcription with the patient, who gave verbal consent to proceed.  History of Present Illness   [name] is a 58 year old female with uncontrollable high blood pressure who presents with flu-like symptoms.  She has been experiencing flu-like symptoms since Saturday night, including nausea, dizziness, and tinnitus in her left ear. She initially vomited but has not vomited since early Sunday morning. She continues to feel nauseous and lacks appetite, although she managed to eat a biscuit and has been consuming soup for the past three days.  She experienced a fever with a maximum temperature of 103F, which occurred yesterday morning. She has not taken any medication for fever today.  She describes a severe cough that initially caused significant pain when she laughs or sneezes, as though she might have cracked her ribs. The cough has since decreased in frequency. She also reports congestion, which has improved somewhat, and she has been using Mucinex and Benadryl to manage these symptoms. Benadryl has been particularly helpful in allowing her to sleep. Her nasal discharge is clear, and she has been using Flonase  since Monday, although she did not take it today. She is taking Benadryl every six to eight hours.  She has a history of uncontrollable high blood pressure and has been advised to be cautious with medications. She has not taken her asthma inhaler but is considering it to help with her symptoms.  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 She is concerned about returning to work and public activities due to  her symptoms.      Patient Active Problem List   Diagnosis Date Noted   Generalized abdominal pain 09/22/2022   Food poisoning due to Escherichia coli 09/22/2022   Morbid obesity (HCC) 08/07/2022   Gastroesophageal reflux disease 08/07/2022   Memory loss 08/07/2022   Hyperlipidemia 08/07/2022   COVID 01/06/2022   Preventative health care 07/29/2021   Hot flashes due to menopause 09/14/2019   Anxiety 09/14/2019   Depression with anxiety 04/07/2019   Essential hypertension 05/24/2018   Class 1 obesity due to excess calories with serious comorbidity and body mass index (BMI) of 32.0 to 32.9 in adult 05/24/2018   Overactive bladder 10/28/2011    Class: History of   Fibroadenoma of breast, left    Menorrhagia 08/13/2011      Review of Systems  Constitutional:  Negative for fever and malaise/fatigue.  HENT:  Negative for congestion.   Eyes:  Negative for blurred vision.  Respiratory:  Positive for cough. Negative for shortness of breath.   Cardiovascular:  Negative for chest pain, palpitations and leg swelling.  Gastrointestinal:  Negative for vomiting.  Musculoskeletal:  Negative for back pain.  Skin:  Negative for rash.  Neurological:  Negative for loss of consciousness and headaches.      Objective:     BP 110/70 (BP Location: Left Arm, Patient Position: Sitting, Cuff Size: Large)   Pulse 85   Temp 98.8 F (37.1 C) (Oral)   Resp 18   Ht 5' 4 (1.626 m)  Wt 198 lb 3.2 oz (89.9 kg)   SpO2 96%   BMI 34.02 kg/m  BP Readings from Last 3 Encounters:  05/26/23 110/70  09/22/22 118/62  09/20/22 122/82   Wt Readings from Last 3 Encounters:  05/26/23 198 lb 3.2 oz (89.9 kg)  09/22/22 184 lb 3.2 oz (83.6 kg)  09/20/22 180 lb (81.6 kg)   SpO2 Readings from Last 3 Encounters:  05/26/23 96%  09/22/22 98%  09/20/22 98%      Physical Exam Vitals and nursing note reviewed.  Constitutional:      General: She is not in acute distress.    Appearance: Normal  appearance. She is well-developed. She is ill-appearing.  HENT:     Head: Normocephalic and atraumatic.  Eyes:     General: No scleral icterus.       Right eye: No discharge.        Left eye: No discharge.  Cardiovascular:     Rate and Rhythm: Normal rate and regular rhythm.     Heart sounds: No murmur heard. Pulmonary:     Effort: Pulmonary effort is normal. No respiratory distress.     Breath sounds: Normal breath sounds.  Musculoskeletal:        General: Normal range of motion.     Cervical back: Normal range of motion and neck supple.     Right lower leg: No edema.     Left lower leg: No edema.  Skin:    General: Skin is warm and dry.  Neurological:     Mental Status: She is alert and oriented to person, place, and time.  Psychiatric:        Mood and Affect: Mood normal.        Behavior: Behavior normal.        Thought Content: Thought content normal.        Judgment: Judgment normal.      Results for orders placed or performed in visit on 05/26/23  POC COVID-19  Result Value Ref Range   SARS Coronavirus 2 Ag Negative Negative  POC Influenza A&B (Binax test)  Result Value Ref Range   Influenza A, POC Positive (A) Negative   Influenza B, POC Negative Negative  POCT rapid strep A  Result Value Ref Range   Rapid Strep A Screen Negative Negative    Last CBC Lab Results  Component Value Date   WBC 4.7 09/20/2022   HGB 12.8 09/20/2022   HCT 39.6 09/20/2022   MCV 88.2 09/20/2022   MCH 28.5 09/20/2022   RDW 13.2 09/20/2022   PLT 247 09/20/2022   Last metabolic panel Lab Results  Component Value Date   GLUCOSE 96 09/20/2022   NA 138 09/20/2022   K 4.0 09/20/2022   CL 105 09/20/2022   CO2 25 09/20/2022   BUN 18 09/20/2022   CREATININE 1.24 (H) 09/20/2022   GFRNONAA 52 (L) 09/20/2022   CALCIUM 8.6 (L) 09/20/2022   PROT 6.9 09/20/2022   ALBUMIN 3.8 09/20/2022   LABGLOB 2.4 05/24/2018   AGRATIO 1.9 05/24/2018   BILITOT 0.8 09/20/2022   ALKPHOS 40  09/20/2022   AST 19 09/20/2022   ALT 12 09/20/2022   ANIONGAP 8 09/20/2022   Last lipids Lab Results  Component Value Date   CHOL 189 08/07/2022   HDL 54.10 08/07/2022   LDLCALC 118 (H) 08/07/2022   TRIG 85.0 08/07/2022   CHOLHDL 3 08/07/2022   Last hemoglobin A1c Lab Results  Component Value Date   HGBA1C  6.2 02/04/2022   Last thyroid  functions Lab Results  Component Value Date   TSH 1.20 08/07/2022   Last vitamin D  Lab Results  Component Value Date   VD25OH 28.14 (L) 08/07/2022   Last vitamin B12 and Folate Lab Results  Component Value Date   VITAMINB12 365 08/07/2022      The 10-year ASCVD risk score (Arnett DK, et al., 2019) is: 2.8%    Assessment & Plan:   Problem List Items Addressed This Visit   None Visit Diagnoses       Flu-like symptoms    -  Primary   Relevant Orders   POC COVID-19 (Completed)   POC Influenza A&B (Binax test) (Completed)     Sore throat       Relevant Orders   POCT rapid strep A (Completed)     Influenza A       Relevant Medications   oseltamivir  (TAMIFLU ) 75 MG capsule   Other Relevant Orders   DG Chest 2 View (Completed)     Acute cough       Relevant Orders   DG Chest 2 View (Completed)     Assessment and Plan    Influenza Acute influenza symptoms include fever up to 103F, nausea, vomiting, cough, and congestion, consistent with the high prevalence of flu cases locally. Lack of a flu shot may have worsened symptoms. Tamiflu  is recommended to reduce symptom duration and severity. Contagion ends once fever-free for 24 hours without antipyretics. Rest and hydration are crucial. Prescribe Tamiflu , advise rest and hydration, provide a work note for absence, and recommend staying home until fever-free for 24 hours without antipyretics. Follow up if symptoms worsen or do not improve.  Nausea Persistent nausea since Sunday morning is likely secondary to influenza. Supportive care with hydration and light meals is advised.  Monitor for worsening symptoms.  Congestion Congestion shows some improvement. Benadryl is used for relief, with Flonase  recommended daily. Consider switching to Claritin if sedation is an issue. Benadryl is safe for hypertension, but decongestants should be avoided.  Cough Cough with chest pain is likely due to severe coughing, with no productive cough present. A chest x-ray is ordered to rule out pneumonia. Monitor for changes in cough or development of a productive cough. Consider antibiotics if pneumonia is confirmed.  Asthma Asthma symptoms may benefit from using an inhaler as needed.  Hypertension Uncontrolled hypertension requires continued blood pressure monitoring. Avoid decongestants that may increase blood pressure.  Follow-up Follow up if symptoms worsen or do not improve. Contact the office with any concerns or questions.        Return if symptoms worsen or fail to improve.    Octavian Godek R Lowne Chase, DO

## 2023-05-26 NOTE — Patient Instructions (Signed)

## 2023-07-20 ENCOUNTER — Other Ambulatory Visit: Payer: Self-pay | Admitting: Family Medicine

## 2023-07-20 DIAGNOSIS — E785 Hyperlipidemia, unspecified: Secondary | ICD-10-CM

## 2023-07-20 DIAGNOSIS — I10 Essential (primary) hypertension: Secondary | ICD-10-CM

## 2023-08-13 ENCOUNTER — Encounter: Payer: Self-pay | Admitting: Family Medicine

## 2023-08-13 ENCOUNTER — Ambulatory Visit (INDEPENDENT_AMBULATORY_CARE_PROVIDER_SITE_OTHER): Payer: BC Managed Care – PPO | Admitting: Family Medicine

## 2023-08-13 VITALS — BP 110/80 | HR 67 | Temp 98.1°F | Resp 16 | Ht 64.0 in | Wt 199.4 lb

## 2023-08-13 DIAGNOSIS — F419 Anxiety disorder, unspecified: Secondary | ICD-10-CM | POA: Diagnosis not present

## 2023-08-13 DIAGNOSIS — R232 Flushing: Secondary | ICD-10-CM | POA: Insufficient documentation

## 2023-08-13 DIAGNOSIS — Z0000 Encounter for general adult medical examination without abnormal findings: Secondary | ICD-10-CM | POA: Diagnosis not present

## 2023-08-13 DIAGNOSIS — E785 Hyperlipidemia, unspecified: Secondary | ICD-10-CM | POA: Diagnosis not present

## 2023-08-13 DIAGNOSIS — I10 Essential (primary) hypertension: Secondary | ICD-10-CM | POA: Diagnosis not present

## 2023-08-13 MED ORDER — ESCITALOPRAM OXALATE 10 MG PO TABS: 10.0000 mg | ORAL_TABLET | Freq: Every day | ORAL | 2 refills | Status: DC

## 2023-08-13 NOTE — Assessment & Plan Note (Signed)
 Ghm utd Check labs  See [facility] Health Maintenance  Topic Date Due   HIV Screening  Never done   COVID-19 Vaccine (5 - 2024-25 season) 12/21/2022   INFLUENZA VACCINE  11/20/2023   Colonoscopy  06/18/2024   MAMMOGRAM  08/07/2024   Cervical Cancer Screening (HPV/Pap Cotest)  08/08/2027   DTaP/Tdap/Td (4 - Td or Tdap) 07/25/2030   Hepatitis C Screening  Completed   Zoster Vaccines- Shingrix  Completed   HPV VACCINES  Aged Out   Meningococcal B Vaccine  Aged Out   Pneumococcal Vaccine 93-31 Years old  Discontinued

## 2023-08-13 NOTE — Progress Notes (Signed)
 Established Patient Office Visit  Subjective   Patient ID: [name], female    DOB: 07-Aug-1965  Age: 58 y.o. MRN: [id number]  Chief Complaint  Patient presents with   Annual Exam    Pt states fasting     HPI Discussed the use of AI scribe software for clinical note transcription with the patient, who gave verbal consent to proceed.  History of Present Illness [name] Revelle is a 58 year old female who presents for an annual physical exam.  She is currently taking Veozah, which effectively manages her menopausal symptoms, including hot flashes. She is also on Lexapro , initially prescribed for anxiety and later increased to help with hot flashes. She wants to decrease the Lexapro  dosage from 20 mg to 10 mg, believing it may contribute to her symptoms.  Her asthma is well-controlled, especially during allergy  season, and she is not on regular medication for it. She occasionally uses a nasal spray for allergies.  She is on telmisartan  and amlodipine  for blood pressure management. Her blood pressure is stable, and she checks it occasionally at home and at the [facility]. She is on a low dose of telmisartan  and 5 mg of amlodipine .  She has a history of kidney concerns and is followed by Dr. Higinio Love at [facility]. Her kidney function is stable, and she is not currently scheduled for a follow-up unless her lab results indicate otherwise.  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 She is proactive about her health, regularly visiting her dermatologist, eye doctor, and dentist.   Patient Active Problem List   Diagnosis Date Noted   Hot flashes 08/13/2023   Generalized abdominal pain 09/22/2022   Food poisoning due to Escherichia coli 09/22/2022   Morbid obesity (HCC) 08/07/2022   Gastroesophageal reflux disease 08/07/2022   Memory loss 08/07/2022   Hyperlipidemia 08/07/2022   COVID 01/06/2022   Preventative health care 07/29/2021   Hot flashes due to  menopause 09/14/2019   Anxiety 09/14/2019   Depression with anxiety 04/07/2019   Essential hypertension, benign 05/24/2018   Class 1 obesity due to excess calories with serious comorbidity and body mass index (BMI) of 32.0 to 32.9 in adult 05/24/2018   Overactive bladder 10/28/2011    Class: History of   Fibroadenoma of breast, left    Menorrhagia 08/13/2011   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   bilateral mammography and left breast U/S in 6 months   Hypertension    Menorrhagia    Past Surgical History:  Procedure Laterality Date   LIPOMA EXCISION Left 04/03/2020   L shoulder    ROTATOR CUFF REPAIR Right 04/03/2020   Dr Deeann Fare    Social History   Tobacco Use   Smoking status: Never    Passive exposure: Never   Smokeless tobacco: Never  Vaping Use   Vaping status: Never Used  Substance Use Topics   Alcohol use: No   Drug use: No   Social History   Socioeconomic History   Marital status: Married    Spouse name: Not on file   Number of children: Not on file   Years of education: Not on file   Highest education level: Doctorate  Occupational History   Not on file  Tobacco Use   Smoking status: Never    Passive exposure: Never   Smokeless tobacco: Never  Vaping Use   Vaping status: Never Used  Substance and Sexual Activity   Alcohol use: No   Drug use: No   Sexual activity: Not on file  Other Topics Concern   Not on file  Social History Narrative   Exercising --- working with trainer , Therapist, art class at [employer] Strain: Low Risk  (09/21/2022)   Overall Financial Resource Strain (CARDIA)    Difficulty of Paying Living Expenses: Not hard at all  Food Insecurity: No Food Insecurity (09/21/2022)   Hunger Vital Sign    Worried About Running Out of Food in the Last Year: Never true    Ran Out of Food in the Last Year: Never true  Transportation Needs: No  Transportation Needs (09/21/2022)   PRAPARE - Administrator, Civil Service (Medical): No    Lack of Transportation (Non-Medical): No  Physical Activity: Sufficiently Active (09/21/2022)   Exercise Vital Sign    Days of Exercise per Week: 4 days    Minutes of Exercise per Session: 60 min  Stress: No Stress Concern Present (09/21/2022)   [employer] of Occupational Health - Occupational Stress Questionnaire    Feeling of Stress : Only a little  Social Connections: Socially Integrated (09/21/2022)   Social Connection and Isolation Panel [NHANES]    Frequency of Communication with Friends and Family: More than three times a week    Frequency of Social Gatherings with Friends and Family: Three times a week    Attends Religious Services: More than 4 times per year    Active Member of Clubs or Organizations: Yes    Attends Engineer, structural: More than 4 times per year    Marital Status: Married  Catering manager Violence: Not on file   Family Status  Relation Name Status   Mother  Alive   Father  Deceased   Sister  (Not Specified)   Brother  (Not Specified)   PGF  (Not Specified)  No partnership data on file   Family History  Problem Relation Age of Onset   Allergic rhinitis Mother    Hypertension Mother    Arthritis Mother    Heart attack Father    Hypertension Father    Diabetes Father    Asthma Sister    Allergic rhinitis Sister    Allergic rhinitis Brother    Heart attack Paternal Grandfather 36   Allergies  Allergen Reactions   Codeine Hives, Itching and Nausea And Vomiting   Tramadol Nausea And Vomiting   Amethyst [Levonorgestrel-Ethinyl Estrad] Palpitations    Other symptoms--slurred speech,headache,dizziness PG CMA      Review of Systems  Constitutional:  Negative for fever and malaise/fatigue.  HENT:  Negative for congestion.   Eyes:  Negative for blurred vision.  Respiratory:  Negative for cough and shortness of breath.    Cardiovascular:  Negative for chest pain, palpitations and leg swelling.  Gastrointestinal:  Negative for abdominal pain, blood in stool, nausea and vomiting.  Genitourinary:  Negative for dysuria and frequency.  Musculoskeletal:  Negative for back pain and falls.  Skin:  Negative for rash.  Neurological:  Negative for dizziness, loss of consciousness and headaches.  Endo/Heme/Allergies:  Negative for environmental allergies.  Psychiatric/Behavioral:  Negative for depression. The patient is not nervous/anxious.       Objective:     BP 110/80 (BP Location: Left Arm, Patient Position: Sitting, Cuff Size: Normal)   Pulse 67   Temp 98.1 F (36.7 C) (Oral)  Resp 16   Ht 5\' 4"  (1.626 m)   Wt 199 lb 6.4 oz (90.4 kg)   SpO2 97%   BMI 34.23 kg/m  BP Readings from Last 3 Encounters:  08/13/23 110/80  05/26/23 110/70  09/22/22 118/62   Wt Readings from Last 3 Encounters:  08/13/23 199 lb 6.4 oz (90.4 kg)  05/26/23 198 lb 3.2 oz (89.9 kg)  09/22/22 184 lb 3.2 oz (83.6 kg)   SpO2 Readings from Last 3 Encounters:  08/13/23 97%  05/26/23 96%  09/22/22 98%      Physical Exam Vitals and nursing note reviewed.  Constitutional:      General: She is not in acute distress.    Appearance: Normal appearance. She is well-developed.  HENT:     Head: Normocephalic and atraumatic.  Eyes:     General: No scleral icterus.       Right eye: No discharge.        Left eye: No discharge.  Cardiovascular:     Rate and Rhythm: Normal rate and regular rhythm.     Heart sounds: No murmur heard. Pulmonary:     Effort: Pulmonary effort is normal. No respiratory distress.     Breath sounds: Normal breath sounds.  Musculoskeletal:        General: Normal range of motion.     Cervical back: Normal range of motion and neck supple.     Right lower leg: No edema.     Left lower leg: No edema.  Skin:    General: Skin is warm and dry.  Neurological:     Mental Status: She is alert and oriented to  person, place, and time.  Psychiatric:        Mood and Affect: Mood normal.        Behavior: Behavior normal.        Thought Content: Thought content normal.        Judgment: Judgment normal.      No results found for any visits on 08/13/23.  Last CBC Lab Results  Component Value Date   WBC 4.7 09/20/2022   HGB 12.8 09/20/2022   HCT 39.6 09/20/2022   MCV 88.2 09/20/2022   MCH 28.5 09/20/2022   RDW 13.2 09/20/2022   PLT 247 09/20/2022   Last metabolic panel Lab Results  Component Value Date   GLUCOSE 96 09/20/2022   NA 138 09/20/2022   K 4.0 09/20/2022   CL 105 09/20/2022   CO2 25 09/20/2022   BUN 18 09/20/2022   CREATININE 1.24 (H) 09/20/2022   GFRNONAA 52 (L) 09/20/2022   CALCIUM 8.6 (L) 09/20/2022   PROT 6.9 09/20/2022   ALBUMIN 3.8 09/20/2022   LABGLOB 2.4 05/24/2018   AGRATIO 1.9 05/24/2018   BILITOT 0.8 09/20/2022   ALKPHOS 40 09/20/2022   AST 19 09/20/2022   ALT 12 09/20/2022   ANIONGAP 8 09/20/2022   Last lipids Lab Results  Component Value Date   CHOL 189 08/07/2022   HDL 54.10 08/07/2022   LDLCALC 118 (H) 08/07/2022   TRIG 85.0 08/07/2022   CHOLHDL 3 08/07/2022   Last hemoglobin A1c Lab Results  Component Value Date   HGBA1C 6.2 02/04/2022   Last thyroid  functions Lab Results  Component Value Date   TSH 1.20 08/07/2022   Last vitamin D  Lab Results  Component Value Date   VD25OH 28.14 (L) 08/07/2022   Last vitamin B12 and Folate Lab Results  Component Value Date   VITAMINB12 365 08/07/2022  The 10-year ASCVD risk score (Arnett DK, et al., 2019) is: 2.8%    Assessment & Plan:   Problem List Items Addressed This Visit       Unprioritized   Anxiety   Relevant Medications   escitalopram  (LEXAPRO ) 10 MG tablet   Preventative health care - Primary   Ghm utd Check labs  See AVS Health Maintenance  Topic Date Due   HIV Screening  Never done   COVID-19 Vaccine (5 - 2024-25 season) 12/21/2022   INFLUENZA VACCINE   11/20/2023   Colonoscopy  06/18/2024   MAMMOGRAM  08/07/2024   Cervical Cancer Screening (HPV/Pap Cotest)  08/08/2027   DTaP/Tdap/Td (4 - Td or Tdap) 07/25/2030   Hepatitis C Screening  Completed   Zoster Vaccines- Shingrix  Completed   HPV VACCINES  Aged Out   Meningococcal B Vaccine  Aged Out   Pneumococcal Vaccine 65-64 Years old  Discontinued         Relevant Orders   CBC with Differential/Platelet   Comprehensive metabolic panel with GFR   Lipid panel   TSH   Hyperlipidemia   Encourage heart healthy diet such as MIND or DASH diet, increase exercise, avoid trans fats, simple carbohydrates and processed foods, consider a krill or fish or flaxseed oil cap daily.        Relevant Orders   Comprehensive metabolic panel with GFR   Lipid panel   Hot flashes   Relevant Medications   Fezolinetant (VEOZAH) 45 MG TABS   Essential hypertension, benign   Well controlled, no changes to meds. Encouraged heart healthy diet such as the DASH diet and exercise as tolerated.        Relevant Orders   CBC with Differential/Platelet   Comprehensive metabolic panel with GFR   Lipid panel   TSH  Assessment and Plan Assessment & Plan Wellness Visit   She is in good health, attending regular appointments with specialists. Her mammogram is current, and a colonoscopy is due in 2026. Continue follow-up with her nephrologist, dermatologist, ophthalmologist, and dentist. Schedule mammogram as per routine screening and colonoscopy in February 2026.  Hypertension   Hypertension is well-controlled with low doses of telmisartan  and amlodipine . Blood pressure is optimal, but weekly home monitoring is advised to ensure stability. Continue telmisartan  and amlodipine . Consider medication adjustment if blood pressure trends downward.  Asthma   Asthma is well-controlled, even during allergy  season, with minimal use of nasal decongestants and no significant symptoms. Continue current asthma management plan  and use nasal decongestants as needed.  Anxiety   Anxiety is managed with Lexapro , which was increased for hot flashes. She reports improvement and wishes to decrease the dosage. Gradually reduce Lexapro  to 10 mg daily and consider further reduction to 5 mg if well-tolerated.  Menopausal symptoms   Menopausal symptoms, particularly hot flashes, are well-managed with Veozah. She reports significant improvement and is satisfied with management despite insurance coverage challenges. Continue Veozah for menopausal symptoms.    No follow-ups on file.    Maryanne Huneycutt R Lowne Chase, DO

## 2023-08-13 NOTE — Assessment & Plan Note (Signed)
 Encourage heart healthy diet such as MIND or DASH diet, increase exercise, avoid trans fats, simple carbohydrates and processed foods, consider a krill or fish or flaxseed oil cap daily.

## 2023-08-13 NOTE — Assessment & Plan Note (Signed)
 Well controlled, no changes to meds. Encouraged heart healthy diet such as the DASH diet and exercise as tolerated.

## 2023-08-14 LAB — CBC WITH DIFFERENTIAL/PLATELET
Basophils Absolute: 0 10*3/uL (ref 0.0–0.1)
Basophils Relative: 1 % (ref 0.0–3.0)
Eosinophils Absolute: 0.1 10*3/uL (ref 0.0–0.7)
Eosinophils Relative: 1.9 % (ref 0.0–5.0)
HCT: 38.2 % (ref 36.0–46.0)
Hemoglobin: 12.8 g/dL (ref 12.0–15.0)
Lymphocytes Relative: 43.5 % (ref 12.0–46.0)
Lymphs Abs: 2 10*3/uL (ref 0.7–4.0)
MCHC: 33.5 g/dL (ref 30.0–36.0)
MCV: 87.2 fl (ref 78.0–100.0)
Monocytes Absolute: 0.3 10*3/uL (ref 0.1–1.0)
Monocytes Relative: 7.2 % (ref 3.0–12.0)
Neutro Abs: 2.1 10*3/uL (ref 1.4–7.7)
Neutrophils Relative %: 46.4 % (ref 43.0–77.0)
Platelets: 280 10*3/uL (ref 150.0–400.0)
RBC: 4.38 Mil/uL (ref 3.87–5.11)
RDW: 14.2 % (ref 11.5–15.5)
WBC: 4.5 10*3/uL (ref 4.0–10.5)

## 2023-08-14 LAB — COMPREHENSIVE METABOLIC PANEL WITH GFR
ALT: 8 U/L (ref 0–35)
AST: 15 U/L (ref 0–37)
Albumin: 4.1 g/dL (ref 3.5–5.2)
Alkaline Phosphatase: 37 U/L — ABNORMAL LOW (ref 39–117)
BUN: 15 mg/dL (ref 6–23)
CO2: 32 meq/L (ref 19–32)
Calcium: 9.1 mg/dL (ref 8.4–10.5)
Chloride: 103 meq/L (ref 96–112)
Creatinine, Ser: 1.3 mg/dL — ABNORMAL HIGH (ref 0.40–1.20)
GFR: 46.3 mL/min — ABNORMAL LOW (ref >60.00)
Glucose, Bld: 78 mg/dL (ref 70–99)
Potassium: 4.4 meq/L (ref 3.5–5.1)
Sodium: 142 meq/L (ref 135–145)
Total Bilirubin: 0.7 mg/dL (ref 0.2–1.2)
Total Protein: 6.5 g/dL (ref 6.0–8.3)

## 2023-08-14 LAB — LIPID PANEL
Cholesterol: 187 mg/dL (ref 0–200)
HDL: 62.9 mg/dL (ref >39.00)
LDL Cholesterol: 111 mg/dL — ABNORMAL HIGH (ref 0–99)
NonHDL: 124.21
Total CHOL/HDL Ratio: 3
Triglycerides: 65 mg/dL (ref 0.0–149.0)
VLDL: 13 mg/dL (ref 0.0–40.0)

## 2023-08-14 LAB — TSH: TSH: 1.71 u[IU]/mL (ref 0.35–5.50)

## 2023-08-22 ENCOUNTER — Encounter: Payer: Self-pay | Admitting: Family Medicine

## 2023-09-23 ENCOUNTER — Other Ambulatory Visit: Payer: Self-pay | Admitting: Family Medicine

## 2023-09-23 DIAGNOSIS — F419 Anxiety disorder, unspecified: Secondary | ICD-10-CM

## 2023-11-04 ENCOUNTER — Other Ambulatory Visit: Payer: Self-pay | Admitting: Family Medicine

## 2023-11-04 DIAGNOSIS — I10 Essential (primary) hypertension: Secondary | ICD-10-CM

## 2023-11-04 DIAGNOSIS — E785 Hyperlipidemia, unspecified: Secondary | ICD-10-CM

## 2023-11-11 ENCOUNTER — Other Ambulatory Visit: Payer: Self-pay | Admitting: Obstetrics and Gynecology

## 2023-12-08 ENCOUNTER — Other Ambulatory Visit: Payer: Self-pay | Admitting: Family Medicine

## 2023-12-08 DIAGNOSIS — F419 Anxiety disorder, unspecified: Secondary | ICD-10-CM

## 2023-12-18 ENCOUNTER — Other Ambulatory Visit: Payer: Self-pay | Admitting: Family Medicine

## 2023-12-18 DIAGNOSIS — J4520 Mild intermittent asthma, uncomplicated: Secondary | ICD-10-CM

## 2024-01-07 ENCOUNTER — Ambulatory Visit (HOSPITAL_COMMUNITY): Admit: 2024-01-07 | Admitting: Obstetrics and Gynecology

## 2024-01-07 SURGERY — URETHROPEXY, USING TRANSVAGINAL TAPE
Anesthesia: General

## 2024-04-15 ENCOUNTER — Encounter: Payer: Self-pay | Admitting: Family Medicine

## 2024-04-26 ENCOUNTER — Encounter: Payer: Self-pay | Admitting: Family Medicine

## 2024-04-26 ENCOUNTER — Ambulatory Visit: Admitting: Family Medicine

## 2024-04-26 DIAGNOSIS — N1832 Chronic kidney disease, stage 3b: Secondary | ICD-10-CM | POA: Diagnosis not present

## 2024-04-26 DIAGNOSIS — K760 Fatty (change of) liver, not elsewhere classified: Secondary | ICD-10-CM | POA: Diagnosis not present

## 2024-04-26 MED ORDER — NONFORMULARY OR COMPOUNDED ITEM: 0 refills | Status: AC

## 2024-04-26 NOTE — Progress Notes (Signed)
 "  Subjective:    Patient ID: [name], female    DOB: 1965-09-17, 59 y.o.   MRN: [id number]  Chief Complaint  Patient presents with   Obesity    Pt here to discuss new weight loss pills    HPI Patient is in today for new weight loss pill.  Discussed the use of AI scribe software for clinical note transcription with the patient, who gave verbal consent to proceed.  History of Present Illness [name] is a 59 year old female who presents for follow-up regarding a new weight loss medication.  She is considering starting a new weight loss medication, which is available at the pharmacy. The medication is priced at $149 for the first two doses, with potential insurance coverage. Possible side effects include nausea, similar to those of Wegovy .  She is currently taking Myrbetriq  for overactive bladder and Veozah, which she has been receiving samples of from her OBGYN due to insurance issues. She has a discount card for [employer] with one refill remaining. She needs a refill for Myrbetriq , which is at the pharmacy.  She has a history of fatty liver noted on ultrasound. She is not prediabetic, and her blood sugar levels have been consistently good. She is under monitoring for kidney function and has an upcoming appointment with a kidney specialist.  Her vitamin D  levels are consistently low, a condition she shares with her daughter. She is not currently taking vitamin D  supplements.    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   bilateral mammography and left breast U/S in 6 months   Hypertension    Menorrhagia     Past Surgical History:  Procedure Laterality Date   LIPOMA EXCISION Left 04/03/2020   L shoulder    ROTATOR CUFF REPAIR Right 04/03/2020   Dr Dozier     Family History  Problem Relation Age of Onset   Allergic rhinitis Mother    Hypertension Mother    Arthritis Mother    Heart attack Father    Hypertension  Father    Diabetes Father    Asthma Sister    Allergic rhinitis Sister    Allergic rhinitis Brother    Heart attack Paternal Grandfather 41    Social History   Socioeconomic History   Marital status: Married    Spouse name: Not on file   Number of children: Not on file   Years of education: Not on file   Highest education level: Doctorate  Occupational History   Not on file  Tobacco Use   Smoking status: Never    Passive exposure: Never   Smokeless tobacco: Never  Vaping Use   Vaping status: Never Used  Substance and Sexual Activity   Alcohol use: No   Drug use: No   Sexual activity: Not on file  Other Topics Concern   Not on file  Social History Narrative   Exercising --- working with trainer , Therapist, Art class at [employer]   Tobacco Use: Low Risk (04/26/2024)   Patient History    Smoking Tobacco Use: Never    Smokeless Tobacco Use: Never    Passive Exposure: Never  Financial Resource Strain: Low Risk (09/21/2022)   Overall Financial Resource Strain (CARDIA)    Difficulty of Paying Living Expenses: Not hard at all  Food Insecurity: No Food Insecurity (09/21/2022)   Hunger Vital Sign    Worried About  Running Out of Food in the Last Year: Never true    Ran Out of Food in the Last Year: Never true  Transportation Needs: No Transportation Needs (09/21/2022)   PRAPARE - Administrator, Civil Service (Medical): No    Lack of Transportation (Non-Medical): No  Physical Activity: Sufficiently Active (09/21/2022)   Exercise Vital Sign    Days of Exercise per Week: 4 days    Minutes of Exercise per Session: 60 min  Stress: No Stress Concern Present (09/21/2022)   [employer] of Occupational Health - Occupational Stress Questionnaire    Feeling of Stress : Only a little  Social Connections: Socially Integrated (09/21/2022)   Social Connection and Isolation Panel    Frequency of Communication with Friends and Family: More than three times a week     Frequency of Social Gatherings with Friends and Family: Three times a week    Attends Religious Services: More than 4 times per year    Active Member of Clubs or Organizations: Yes    Attends Banker Meetings: More than 4 times per year    Marital Status: Married  Catering Manager Violence: Not on file  Depression (PHQ2-9): Low Risk (08/13/2023)   Depression (PHQ2-9)    PHQ-2 Score: 0  Alcohol Screen: Not on file  Housing: Low Risk (09/21/2022)   Housing    Last Housing Risk Score: 0  Utilities: Not on file  Health Literacy: Not on file    Outpatient Medications Prior to Visit  Medication Sig Dispense Refill   albuterol  (VENTOLIN  HFA) 108 (90 Base) MCG/ACT inhaler INHALE 1-2 PUFFS BY MOUTH EVERY 6 HOURS AS NEEDED FOR WHEEZE OR SHORTNESS OF BREATH 18 each 5   amLODipine  (NORVASC ) 5 MG tablet TAKE 1 TABLET (5 MG TOTAL) BY MOUTH DAILY. 90 tablet 2   escitalopram  (LEXAPRO ) 10 MG tablet TAKE 1 TABLET BY MOUTH EVERY DAY 90 tablet 1   Fezolinetant (VEOZAH) 45 MG TABS Take 1 tablet (45 mg total) by mouth daily.     fluticasone  (FLONASE ) 50 MCG/ACT nasal spray Place 2 sprays into both nostrils daily. 16 g 6   mirabegron  ER (MYRBETRIQ ) 50 MG TB24 tablet Take 50 mg by mouth daily.     mirabegron  ER (MYRBETRIQ ) 50 MG TB24 tablet Take 1 tablet (50 mg total) by mouth daily. 90 tablet 3   solifenacin (VESICARE) 10 MG tablet Take 10 mg by mouth daily.     telmisartan  (MICARDIS ) 40 MG tablet Take 1 tablet (40 mg total) by mouth daily. 90 tablet 2   scopolamine  (TRANSDERM-SCOP) 1 MG/3DAYS PLACE 1 PATCH ONTO THE SKIN EVERY 3 DAYS. 9 patch 14   dicyclomine  (BENTYL ) 20 MG tablet Take 1 tablet (20 mg total) by mouth 3 (three) times daily before meals. (Patient not taking: Reported on 04/26/2024) 20 tablet 0   meclizine  (ANTIVERT ) 25 MG tablet Take 1 tablet (25 mg total) by mouth 3 (three) times daily as needed for dizziness. (Patient not taking: Reported on 04/26/2024) 30 tablet 0   ondansetron   (ZOFRAN ) 4 MG tablet Take 1 tablet (4 mg total) by mouth every 8 (eight) hours as needed for nausea or vomiting. (Patient not taking: Reported on 04/26/2024) 20 tablet 0   ondansetron  (ZOFRAN ) 4 MG tablet Take 1 tablet (4 mg total) by mouth every 8 (eight) hours as needed for nausea or vomiting. (Patient not taking: Reported on 08/13/2023) 20 tablet 0   No facility-administered medications prior to visit.    Allergies[1]  Review of Systems  Constitutional:  Negative for fever and malaise/fatigue.  HENT:  Negative for congestion.   Eyes:  Negative for blurred vision.  Respiratory:  Negative for cough and shortness of breath.   Cardiovascular:  Negative for chest pain, palpitations and leg swelling.  Gastrointestinal:  Negative for vomiting.  Musculoskeletal:  Negative for back pain.  Skin:  Negative for rash.  Neurological:  Negative for loss of consciousness and headaches.       Objective:    Physical Exam Vitals and nursing note reviewed.  Constitutional:      General: She is not in acute distress.    Appearance: Normal appearance. She is well-developed.  HENT:     Head: Normocephalic and atraumatic.  Eyes:     General: No scleral icterus.       Right eye: No discharge.        Left eye: No discharge.  Cardiovascular:     Rate and Rhythm: Normal rate and regular rhythm.     Heart sounds: No murmur heard. Pulmonary:     Effort: Pulmonary effort is normal. No respiratory distress.     Breath sounds: Normal breath sounds.  Musculoskeletal:        General: Normal range of motion.     Cervical back: Normal range of motion and neck supple.     Right lower leg: No edema.     Left lower leg: No edema.  Skin:    General: Skin is warm and dry.  Neurological:     Mental Status: She is alert and oriented to person, place, and time.  Psychiatric:        Mood and Affect: Mood normal.        Behavior: Behavior normal.        Thought Content: Thought content normal.        Judgment:  Judgment normal.     BP 128/80 (BP Location: Right Arm, Patient Position: Sitting, Cuff Size: Large)   Pulse 68   Temp 98.3 F (36.8 C) (Oral)   Resp 16   Ht 5' 4 (1.626 m)   Wt 205 lb 3.2 oz (93.1 kg)   SpO2 97%   BMI 35.22 kg/m  Wt Readings from Last 3 Encounters:  04/26/24 205 lb 3.2 oz (93.1 kg)  08/13/23 199 lb 6.4 oz (90.4 kg)  05/26/23 198 lb 3.2 oz (89.9 kg)    Diabetic Foot Exam - Simple   No data filed    Lab Results  Component Value Date   WBC 4.5 08/13/2023   HGB 12.8 08/13/2023   HCT 38.2 08/13/2023   PLT 280.0 08/13/2023   GLUCOSE 78 08/13/2023   CHOL 187 08/13/2023   TRIG 65.0 08/13/2023   HDL 62.90 08/13/2023   LDLCALC 111 (H) 08/13/2023   ALT 8 08/13/2023   AST 15 08/13/2023   NA 142 08/13/2023   K 4.4 08/13/2023   CL 103 08/13/2023   CREATININE 1.30 (H) 08/13/2023   BUN 15 08/13/2023   CO2 32 08/13/2023   TSH 1.71 08/13/2023   HGBA1C 6.2 02/04/2022    Lab Results  Component Value Date   TSH 1.71 08/13/2023   Lab Results  Component Value Date   WBC 4.5 08/13/2023   HGB 12.8 08/13/2023   HCT 38.2 08/13/2023   MCV 87.2 08/13/2023   PLT 280.0 08/13/2023   Lab Results  Component Value Date   NA 142 08/13/2023   K 4.4 08/13/2023   CO2 32 08/13/2023  GLUCOSE 78 08/13/2023   BUN 15 08/13/2023   CREATININE 1.30 (H) 08/13/2023   BILITOT 0.7 08/13/2023   ALKPHOS 37 (L) 08/13/2023   AST 15 08/13/2023   ALT 8 08/13/2023   PROT 6.5 08/13/2023   ALBUMIN 4.1 08/13/2023   CALCIUM 9.1 08/13/2023   ANIONGAP 8 09/20/2022   GFR 46.30 (L) 08/13/2023   Lab Results  Component Value Date   CHOL 187 08/13/2023   Lab Results  Component Value Date   HDL 62.90 08/13/2023   Lab Results  Component Value Date   LDLCALC 111 (H) 08/13/2023   Lab Results  Component Value Date   TRIG 65.0 08/13/2023   Lab Results  Component Value Date   CHOLHDL 3 08/13/2023   Lab Results  Component Value Date   HGBA1C 6.2 02/04/2022        Assessment & Plan:  Morbid obesity (HCC) -     CBC with Differential/Platelet -     Comprehensive metabolic panel with GFR -     Lipid panel -     TSH -     Insulin , random -     VITAMIN D  25 Hydroxy (Vit-D Deficiency, Fractures)  Fatty liver -     NONFORMULARY OR COMPOUNDED ITEM; Wegovy  1.5 mg #30 1 po every day  Dispense: 30 each; Refill: 0 -     CBC with Differential/Platelet -     Comprehensive metabolic panel with GFR -     Lipid panel -     TSH -     Insulin , random -     VITAMIN D  25 Hydroxy (Vit-D Deficiency, Fractures)  Stage 3b chronic kidney disease (HCC) -     NONFORMULARY OR COMPOUNDED ITEM; Wegovy  1.5 mg #30 1 po every day  Dispense: 30 each; Refill: 0 -     CBC with Differential/Platelet -     Comprehensive metabolic panel with GFR -     Lipid panel -     TSH -     Insulin , random -     VITAMIN D  25 Hydroxy (Vit-D Deficiency, Fractures)   Assessment and Plan Assessment & Plan Morbid obesity   Management includes Wegovy , a weight loss medication. Discussed potential side effects, such as nausea, which may improve over time. No significant drug interactions with current medications. Insurance coverage and cost considerations were addressed. She understands the need to take the medication on an empty stomach and monitor for severe side effects like inability to eat. Prescribed Wegovy  1.5 mg once daily for one month. Instructed to take it on an empty stomach in the morning with water, swallow whole, and wait at least 30 minutes before eating. Advised to notify a week before running out for the next dose.  Fatty liver   Previously identified on ultrasound. Wegovy  prescription considered due to this condition. Continue to monitor liver function as part of overall health management.  Stage 3b chronic kidney disease   Stage 3b chronic kidney disease is present. Monitoring kidney function is ongoing.   Jamee JONELLE Shanks Chase, DO     [1]  Allergies Allergen  Reactions   Codeine Hives, Itching and Nausea And Vomiting   Tramadol Nausea And Vomiting   Amethyst [Levonorgestrel-Ethinyl Estrad] Palpitations    Other symptoms--slurred speech,headache,dizziness PG CMA   "

## 2024-04-26 NOTE — Patient Instructions (Signed)
 [id number]

## 2024-04-27 ENCOUNTER — Other Ambulatory Visit (HOSPITAL_BASED_OUTPATIENT_CLINIC_OR_DEPARTMENT_OTHER): Payer: Self-pay

## 2024-04-27 LAB — LIPID PANEL
Cholesterol: 209 mg/dL — ABNORMAL HIGH (ref 28–200)
HDL: 60.6 mg/dL
LDL Cholesterol: 125 mg/dL — ABNORMAL HIGH (ref 10–99)
NonHDL: 148.03
Total CHOL/HDL Ratio: 3
Triglycerides: 113 mg/dL (ref 10.0–149.0)
VLDL: 22.6 mg/dL (ref 0.0–40.0)

## 2024-04-27 LAB — COMPREHENSIVE METABOLIC PANEL WITH GFR
ALT: 10 U/L (ref 3–35)
AST: 18 U/L (ref 5–37)
Albumin: 4.5 g/dL (ref 3.5–5.2)
Alkaline Phosphatase: 41 U/L (ref 39–117)
BUN: 25 mg/dL — ABNORMAL HIGH (ref 6–23)
CO2: 27 meq/L (ref 19–32)
Calcium: 9.4 mg/dL (ref 8.4–10.5)
Chloride: 104 meq/L (ref 96–112)
Creatinine, Ser: 1.23 mg/dL — ABNORMAL HIGH (ref 0.40–1.20)
GFR: 49.23 mL/min — ABNORMAL LOW
Glucose, Bld: 84 mg/dL (ref 70–99)
Potassium: 4.1 meq/L (ref 3.5–5.1)
Sodium: 139 meq/L (ref 135–145)
Total Bilirubin: 0.6 mg/dL (ref 0.2–1.2)
Total Protein: 6.8 g/dL (ref 6.0–8.3)

## 2024-04-27 LAB — CBC WITH DIFFERENTIAL/PLATELET
Basophils Absolute: 0.1 K/uL (ref 0.0–0.1)
Basophils Relative: 1.4 % (ref 0.0–3.0)
Eosinophils Absolute: 0.1 K/uL (ref 0.0–0.7)
Eosinophils Relative: 1.7 % (ref 0.0–5.0)
HCT: 39.7 % (ref 36.0–46.0)
Hemoglobin: 13.1 g/dL (ref 12.0–15.0)
Lymphocytes Relative: 33.6 % (ref 12.0–46.0)
Lymphs Abs: 2 K/uL (ref 0.7–4.0)
MCHC: 33.1 g/dL (ref 30.0–36.0)
MCV: 86.2 fl (ref 78.0–100.0)
Monocytes Absolute: 0.4 K/uL (ref 0.1–1.0)
Monocytes Relative: 6.4 % (ref 3.0–12.0)
Neutro Abs: 3.4 K/uL (ref 1.4–7.7)
Neutrophils Relative %: 56.9 % (ref 43.0–77.0)
Platelets: 269 K/uL (ref 150.0–400.0)
RBC: 4.6 Mil/uL (ref 3.87–5.11)
RDW: 13.6 % (ref 11.5–15.5)
WBC: 6 K/uL (ref 4.0–10.5)

## 2024-04-27 LAB — TSH: TSH: 2.1 u[IU]/mL (ref 0.35–5.50)

## 2024-04-27 LAB — VITAMIN D 25 HYDROXY (VIT D DEFICIENCY, FRACTURES): VITD: 30.59 ng/mL (ref 30.00–100.00)

## 2024-04-27 LAB — INSULIN, RANDOM: Insulin: 6.2 u[IU]/mL

## 2024-04-30 ENCOUNTER — Ambulatory Visit: Payer: Self-pay | Admitting: Family Medicine

## 2024-05-02 ENCOUNTER — Other Ambulatory Visit (HOSPITAL_BASED_OUTPATIENT_CLINIC_OR_DEPARTMENT_OTHER): Payer: Self-pay

## 2024-05-04 ENCOUNTER — Encounter (HOSPITAL_BASED_OUTPATIENT_CLINIC_OR_DEPARTMENT_OTHER): Payer: Self-pay

## 2024-05-04 ENCOUNTER — Other Ambulatory Visit (HOSPITAL_BASED_OUTPATIENT_CLINIC_OR_DEPARTMENT_OTHER): Payer: Self-pay

## 2024-05-04 MED ORDER — WEGOVY 1.5 MG PO TABS
1.5000 mg | ORAL_TABLET | Freq: Every day | ORAL | 0 refills | Status: DC
  Filled 2024-05-04: qty 30, 30d supply, fill #0

## 2024-05-05 ENCOUNTER — Encounter (HOSPITAL_BASED_OUTPATIENT_CLINIC_OR_DEPARTMENT_OTHER): Payer: Self-pay

## 2024-05-05 ENCOUNTER — Other Ambulatory Visit (HOSPITAL_BASED_OUTPATIENT_CLINIC_OR_DEPARTMENT_OTHER): Payer: Self-pay

## 2024-05-06 ENCOUNTER — Other Ambulatory Visit (HOSPITAL_BASED_OUTPATIENT_CLINIC_OR_DEPARTMENT_OTHER): Payer: Self-pay

## 2024-05-25 ENCOUNTER — Other Ambulatory Visit: Payer: Self-pay | Admitting: Family Medicine

## 2024-05-26 ENCOUNTER — Other Ambulatory Visit (HOSPITAL_BASED_OUTPATIENT_CLINIC_OR_DEPARTMENT_OTHER): Payer: Self-pay

## 2024-05-26 MED ORDER — WEGOVY 1.5 MG PO TABS
1.5000 mg | ORAL_TABLET | Freq: Every day | ORAL | 0 refills | Status: AC
  Filled 2024-05-26: qty 30, 30d supply, fill #0

## 2024-08-16 ENCOUNTER — Encounter: Admitting: Family Medicine
